# Patient Record
Sex: Female | Born: 1980 | Race: Black or African American | Hispanic: No | Marital: Married | Smoking: Former smoker
Health system: Southern US, Community
[De-identification: ages and names within clinical notes are randomized; demographics above are authoritative.]

## PROBLEM LIST (undated history)

## (undated) ENCOUNTER — Inpatient Hospital Stay (HOSPITAL_COMMUNITY): Payer: Self-pay

## (undated) DIAGNOSIS — I499 Cardiac arrhythmia, unspecified: Secondary | ICD-10-CM

## (undated) DIAGNOSIS — F4310 Post-traumatic stress disorder, unspecified: Secondary | ICD-10-CM

## (undated) DIAGNOSIS — D649 Anemia, unspecified: Secondary | ICD-10-CM

## (undated) HISTORY — PX: BREAST BIOPSY: SHX20

## (undated) HISTORY — PX: TENDON RECONSTRUCTION: SHX2487

---

## 2001-06-15 HISTORY — PX: BREAST BIOPSY: SHX20

## 2007-05-25 ENCOUNTER — Other Ambulatory Visit: Admission: RE | Admit: 2007-05-25 | Discharge: 2007-05-25 | Payer: Self-pay | Admitting: Gynecology

## 2007-10-27 ENCOUNTER — Other Ambulatory Visit: Admission: RE | Admit: 2007-10-27 | Discharge: 2007-10-27 | Payer: Self-pay | Admitting: Gynecology

## 2008-01-02 ENCOUNTER — Encounter: Admission: RE | Admit: 2008-01-02 | Discharge: 2008-01-02 | Payer: Self-pay | Admitting: Family Medicine

## 2008-03-27 ENCOUNTER — Other Ambulatory Visit: Admission: RE | Admit: 2008-03-27 | Discharge: 2008-03-27 | Payer: Self-pay | Admitting: Gynecology

## 2008-04-04 ENCOUNTER — Encounter (INDEPENDENT_AMBULATORY_CARE_PROVIDER_SITE_OTHER): Payer: Self-pay | Admitting: *Deleted

## 2008-04-04 ENCOUNTER — Ambulatory Visit (HOSPITAL_BASED_OUTPATIENT_CLINIC_OR_DEPARTMENT_OTHER): Admission: RE | Admit: 2008-04-04 | Discharge: 2008-04-04 | Payer: Self-pay | Admitting: *Deleted

## 2008-04-05 ENCOUNTER — Emergency Department (HOSPITAL_COMMUNITY): Admission: EM | Admit: 2008-04-05 | Discharge: 2008-04-05 | Payer: Self-pay | Admitting: Emergency Medicine

## 2008-08-07 ENCOUNTER — Emergency Department (HOSPITAL_COMMUNITY): Admission: EM | Admit: 2008-08-07 | Discharge: 2008-08-07 | Payer: Self-pay | Admitting: Emergency Medicine

## 2009-01-18 ENCOUNTER — Emergency Department (HOSPITAL_COMMUNITY): Admission: EM | Admit: 2009-01-18 | Discharge: 2009-01-18 | Payer: Self-pay | Admitting: Family Medicine

## 2009-08-13 ENCOUNTER — Ambulatory Visit (HOSPITAL_COMMUNITY): Admission: RE | Admit: 2009-08-13 | Discharge: 2009-08-13 | Payer: Self-pay | Admitting: Gynecology

## 2009-12-24 ENCOUNTER — Emergency Department (HOSPITAL_COMMUNITY): Admission: EM | Admit: 2009-12-24 | Discharge: 2009-12-24 | Payer: Self-pay | Admitting: Emergency Medicine

## 2009-12-24 ENCOUNTER — Emergency Department (HOSPITAL_COMMUNITY): Admission: EM | Admit: 2009-12-24 | Discharge: 2009-12-24 | Payer: Self-pay | Admitting: Family Medicine

## 2010-02-20 ENCOUNTER — Emergency Department (HOSPITAL_COMMUNITY): Admission: EM | Admit: 2010-02-20 | Discharge: 2010-02-20 | Payer: Self-pay | Admitting: Emergency Medicine

## 2010-04-30 ENCOUNTER — Encounter: Admission: RE | Admit: 2010-04-30 | Discharge: 2010-04-30 | Payer: Self-pay | Admitting: Family Medicine

## 2010-05-12 ENCOUNTER — Emergency Department (HOSPITAL_COMMUNITY)
Admission: EM | Admit: 2010-05-12 | Discharge: 2010-05-12 | Payer: Self-pay | Source: Home / Self Care | Admitting: Emergency Medicine

## 2010-08-26 LAB — POCT I-STAT, CHEM 8
BUN: 10 mg/dL (ref 6–23)
Creatinine, Ser: 0.9 mg/dL (ref 0.4–1.2)
Glucose, Bld: 104 mg/dL — ABNORMAL HIGH (ref 70–99)
Potassium: 3.7 mEq/L (ref 3.5–5.1)
Sodium: 140 mEq/L (ref 135–145)

## 2010-08-26 LAB — URINALYSIS, ROUTINE W REFLEX MICROSCOPIC
Bilirubin Urine: NEGATIVE
Hgb urine dipstick: NEGATIVE
Ketones, ur: >80 mg/dL — AB
Specific Gravity, Urine: 1.023 (ref 1.005–1.030)
Urobilinogen, UA: 1 mg/dL (ref 0.0–1.0)

## 2010-08-31 LAB — POCT I-STAT, CHEM 8
BUN: 16 mg/dL (ref 6–23)
Creatinine, Ser: 0.8 mg/dL (ref 0.4–1.2)
Glucose, Bld: 102 mg/dL — ABNORMAL HIGH (ref 70–99)
Potassium: 3.8 mEq/L (ref 3.5–5.1)
Sodium: 141 mEq/L (ref 135–145)

## 2010-08-31 LAB — POCT CARDIAC MARKERS
CKMB, poc: <1 ng/mL — ABNORMAL LOW (ref 1.0–8.0)
Myoglobin, poc: 34 ng/mL (ref 12–200)
Troponin i, poc: <0.05 ng/mL (ref 0.00–0.09)

## 2010-09-20 LAB — POCT URINALYSIS DIP (DEVICE)
Nitrite: NEGATIVE
Protein, ur: NEGATIVE mg/dL
Urobilinogen, UA: 1 mg/dL (ref 0.0–1.0)

## 2010-09-20 LAB — POCT PREGNANCY, URINE: Preg Test, Ur: NEGATIVE

## 2010-09-30 LAB — PREGNANCY, URINE: Preg Test, Ur: NEGATIVE

## 2010-10-28 NOTE — Op Note (Signed)
[name], [name]             ACCOUNT NO.:  [id number]   MEDICAL RECORD NO.:  [id number]          PATIENT TYPE:  AMB   LOCATION:  DSC                          FACILITY:  [facility]   PHYSICIAN:  Tennis Must Meyerdierks, M.D.DATE OF BIRTH:  13-Dec-1980   DATE OF PROCEDURE:  04/04/2008  DATE OF DISCHARGE:                               OPERATIVE REPORT   PREOPERATIVE DIAGNOSIS:  Chronic ulnar collateral ligament tear,  metacarpophalangeal joint, right thumb.   POSTOPERATIVE DIAGNOSIS:  Chronic ulnar collateral ligament tear,  metacarpophalangeal joint, right thumb with ganglion cyst formation.   PROCEDURE:  Excision of ganglion cyst with reconstruction of ulnar  collateral ligament, metacarpophalangeal joint, right thumb using local  tissue.   SURGEON:  Lowell Bouton, MD   ANESTHESIA:  General.   OPERATIVE FINDINGS:  The patient had a cyst that was parallel and dorsal  to the ulnar collateral ligament.  It ran along the ligament from the MP  joint to the attachment.  The collateral ligament itself was slightly  attenuated.  The joint surface was smooth.   PROCEDURE IN DETAIL:  Under general anesthesia with a tourniquet on the  right arm, the right hand was prepped and draped in usual fashion.  After exsanguinating the limb, the tourniquet was inflated to 250 mmHg.  A gently curved incision was made over the ulnar side of the MP joint of  the right thumb and carried down through the subcutaneous tissues.  Blunt dissection was carried down to the abductor aponeurosis and the  patient had a well-defined adductor muscle.  The abductor aponeurosis  was divided longitudinally and the ulnar collateral ligament was  identified, but there was a mass overlying it that almost looked like an  accessory muscle along the dorsum of the ligament.  When it was incised,  there was fluid and it was found to be a ganglion-type cyst but with  darker color, almost the color of a vein.  The  cyst was traced back to  the joint and was completely excised.  The collateral ligament was then  examined and was found to be slightly attenuated.  It was felt that  there was adequate tissue to advance that and not use a graft for  reconstruction.  The ligament was released from its insertion on the  proximal phalanx and a drill hole was made just distal.  The distal end  of the ligament was then prepared with a 2-0 Prolene Kessler suture.  The suture was then passed through with Mellody Dance needles from the ulnar to  the radial aspect of the proximal phalanx.  It was tucked into the drill  hole to tighten the ligament.  The sutures were then tied over a button  radially and the MP joint appeared to be stable.  X-rays showed that the  joint was slightly subluxed volarly, so it was pinned in a reduced  position.  A 0.045 K-wire was used to pin the joint and was bent over  and left protruding from the skin.  The wound was then irrigated.  The  abductor aponeurosis was closed with 4-0 Vicryl.  Skin was closed  with a 3-0 subcuticular Prolene.  Marcaine 0.5% was then placed in the  skin edges for pain control.  Sterile dressings were applied followed by  a thumb spica splint.  The tourniquet was released with good circulation  of the hand.  The patient went to the recovery room awake in stable and  good condition.      Lowell Bouton, M.D.  Electronically Signed     EMM/MEDQ  D:  04/04/2008  T:  04/05/2008  Job:  [id number]

## 2011-03-16 LAB — POCT I-STAT, CHEM 8
Calcium, Ion: 1.04 — ABNORMAL LOW
Chloride: 108
Glucose, Bld: 97
HCT: 36
Hemoglobin: 12.2
TCO2: 23

## 2011-08-02 ENCOUNTER — Emergency Department (HOSPITAL_COMMUNITY)
Admission: EM | Admit: 2011-08-02 | Discharge: 2011-08-02 | Disposition: A | Discharge status: Home / Self Care | Payer: Managed Care, Other (non HMO) | Attending: Emergency Medicine | Admitting: Emergency Medicine

## 2011-08-02 ENCOUNTER — Encounter (HOSPITAL_COMMUNITY): Payer: Self-pay | Admitting: *Deleted

## 2011-08-02 DIAGNOSIS — R102 Pelvic and perineal pain: Secondary | ICD-10-CM

## 2011-08-02 DIAGNOSIS — R10819 Abdominal tenderness, unspecified site: Secondary | ICD-10-CM | POA: Insufficient documentation

## 2011-08-02 DIAGNOSIS — N949 Unspecified condition associated with female genital organs and menstrual cycle: Secondary | ICD-10-CM | POA: Insufficient documentation

## 2011-08-02 HISTORY — DX: Cardiac arrhythmia, unspecified: I49.9

## 2011-08-02 HISTORY — DX: Anemia, unspecified: D64.9

## 2011-08-02 LAB — URINALYSIS, ROUTINE W REFLEX MICROSCOPIC
Bilirubin Urine: NEGATIVE
Ketones, ur: NEGATIVE mg/dL
Nitrite: NEGATIVE
pH: 6.5 (ref 5.0–8.0)

## 2011-08-02 NOTE — ED Notes (Signed)
Patient given discharge instructions, information, prescriptions, and diet order. Patient states that they adequately understand discharge information given and to return to ED if symptoms return or worsen.     

## 2011-08-02 NOTE — ED Provider Notes (Signed)
History     CSN: [id number]  Arrival date & time 08/02/11  0909   First MD Initiated Contact with Patient 08/02/11 [phone]      Chief Complaint  Patient presents with  . Abdominal Pain   pelvic pain  (Consider location/radiation/quality/duration/timing/severity/associated sxs/prior treatment) HPI This 31 year old female has a history of a ruptured ovarian cyst in the past. This morning she was going to the bathroom and felt fine then after she urinated without difficulty she had sudden onset of severe left pelvic pain just like prior ruptured cyst. The patient was maximum intensity at its onset and she felt worse and she tried to move around she wanted to stay still. The pain is rapidly improving and she took Tylenol just prior to arrival her pain is much better now and is mild to moderate now. The pain is not a colicky pain at all. The pain is much better now than when it first started. She is no vaginal bleeding or discharge. She has no difficulty urinating or dysuria. She has no flank pain upper abdominal pain nausea vomiting chest pain shortness of breath rashes fevers or trauma. The pain is nonradiating and without associated symptoms.  Past Medical History  Diagnosis Date  . Anemia   . Irregular heartbeat     Past Surgical History  Procedure Date  . Tendon reconstruction     Right thumb  . Breast biopsy     Right breast    History reviewed. No pertinent family history.  History  Substance Use Topics  . Smoking status: Never Smoker   . Smokeless tobacco: Not on file  . Alcohol Use: No    OB History    Grav Para Term Preterm Abortions TAB SAB Ect Mult Living                  Review of Systems  Constitutional: Negative for fever.       10 Systems reviewed and are negative for acute change except as noted in the HPI.  HENT: Negative for congestion.   Eyes: Negative for discharge and redness.  Respiratory: Negative for cough and shortness of breath.   Cardiovascular:  Negative for chest pain.  Gastrointestinal: Positive for abdominal pain. Negative for nausea, vomiting and diarrhea.  Genitourinary: Negative for dysuria, vaginal bleeding and vaginal discharge.  Musculoskeletal: Negative for back pain.  Skin: Negative for rash.  Neurological: Negative for syncope, numbness and headaches.  Psychiatric/Behavioral:       No behavior change.    Allergies  Codeine  Home Medications   Current Outpatient Rx  Name Route Sig Dispense Refill  . ACETAMINOPHEN 500 MG PO TABS Oral Take 1,000 mg by mouth every 6 (six) hours as needed. For pain.      BP 111/74  Pulse 67  Temp(Src) 98.6 F (37 C) (Oral)  Resp 16  Ht 5\' 8"  (1.727 m)  Wt 120 lb (54.432 kg)  BMI 18.25 kg/m2  SpO2 100%  LMP 07/09/2011  Physical Exam  Nursing note and vitals reviewed. Constitutional:       Awake, alert, nontoxic appearance.  HENT:  Head: Atraumatic.  Eyes: Right eye exhibits no discharge. Left eye exhibits no discharge.  Neck: Neck supple.  Cardiovascular: Normal rate and regular rhythm.   No murmur heard. Pulmonary/Chest: Effort normal and breath sounds normal. No respiratory distress. She has no wheezes. She has no rales. She exhibits no tenderness.  Abdominal: Soft. Bowel sounds are normal. She exhibits no mass. There is  tenderness. There is no rebound and no guarding.       Minimal left suprapubic tenderness the rest of the abdomen is nontender there is no rebound  Genitourinary:       Chaperone was present for bimanual examination with cervical motion tenderness present in the left adnexal tenderness present no right adnexal tenderness the rest of her abdomen is nontender there is no bleeding or discharge noted in the examination glove and no palpable masses  Musculoskeletal: She exhibits no tenderness.       Baseline ROM, no obvious new focal weakness.  Neurological: She is alert.       Mental status and motor strength appears baseline for patient and situation.    Skin: No rash noted.  Psychiatric: She has a normal mood and affect.    ED Course  Procedures (including critical care time) Patient / Family / Caregiver understand and agree with initial ED impression and plan with expectations set for ED visit. I doubt pelvic inflammatory disease or ovarian torsion. Pregnancy test and urinalysis are pending. The patient agrees there does not appear to be an indication for emergency pelvic ultrasound unless her pregnancy test is positive.  The patient does not want prescribed analgesics at all, she states her pain is controlled with Tylenol, her pain is not worsening at all in the emergency department and clinically we do not suspect ovarian torsion or peritonitis therefore I do not think the patient needs imaging today she understands and agrees with this assessment and plan as well. Her presentation is most suspicious for a ruptured ovarian cyst without apparent immediate complications. Labs Reviewed  URINALYSIS, ROUTINE W REFLEX MICROSCOPIC - Abnormal; Notable for the following:    APPearance CLOUDY (*)    All other components within normal limits  POCT PREGNANCY, URINE  LAB REPORT - SCANNED   No results found.   1. Pelvic pain       MDM  I doubt any other EMC precluding discharge at this time including, but not necessarily limited to the following:SBI, peritonitis, torsion, PID.        Hurman Horn, MD 08/03/11 2215

## 2011-08-02 NOTE — ED Notes (Signed)
Per pt- experienced a sharp pain in her left lower abdomen this morning after using the restroom. Pain was sharp and intense and now has subsided to a dull aching pain.

## 2011-09-01 ENCOUNTER — Encounter (HOSPITAL_COMMUNITY): Payer: Self-pay | Admitting: Emergency Medicine

## 2011-09-01 ENCOUNTER — Emergency Department (HOSPITAL_COMMUNITY)
Admission: EM | Admit: 2011-09-01 | Discharge: 2011-09-01 | Disposition: A | Discharge status: Home / Self Care | Payer: Managed Care, Other (non HMO) | Attending: Emergency Medicine | Admitting: Emergency Medicine

## 2011-09-01 DIAGNOSIS — Z0389 Encounter for observation for other suspected diseases and conditions ruled out: Secondary | ICD-10-CM | POA: Insufficient documentation

## 2011-09-01 LAB — URINE MICROSCOPIC-ADD ON

## 2011-09-01 LAB — URINALYSIS, ROUTINE W REFLEX MICROSCOPIC
Glucose, UA: NEGATIVE mg/dL
Protein, ur: NEGATIVE mg/dL

## 2011-09-01 LAB — POCT PREGNANCY, URINE: Preg Test, Ur: NEGATIVE

## 2011-09-01 NOTE — ED Notes (Signed)
Patient left AMA.

## 2011-09-01 NOTE — ED Notes (Signed)
Pt c/o nausea, vomiting and diarrhea. Onset this am.

## 2011-09-29 ENCOUNTER — Encounter (HOSPITAL_COMMUNITY): Payer: Self-pay | Admitting: Emergency Medicine

## 2011-09-29 ENCOUNTER — Emergency Department (INDEPENDENT_AMBULATORY_CARE_PROVIDER_SITE_OTHER)
Admission: EM | Admit: 2011-09-29 | Discharge: 2011-09-29 | Disposition: A | Discharge status: Home / Self Care | Payer: Managed Care, Other (non HMO) | Source: Home / Self Care | Attending: Family Medicine | Admitting: Family Medicine

## 2011-09-29 DIAGNOSIS — J020 Streptococcal pharyngitis: Secondary | ICD-10-CM

## 2011-09-29 LAB — POCT RAPID STREP A: Streptococcus, Group A Screen (Direct): POSITIVE — AB

## 2011-09-29 MED ORDER — AMOXICILLIN 500 MG PO CAPS: 500.0000 mg | ORAL_CAPSULE | Freq: Three times a day (TID) | ORAL | Status: AC

## 2011-09-29 NOTE — Discharge Instructions (Signed)
Drink lots of fluids, take all of medicine, use lozenges as needed.return if needed °

## 2011-09-29 NOTE — ED Notes (Signed)
Sore throat, chills, body aches, and headache.  Reports fever of 100.4.  Denies nausea, vomiting or diarrhea

## 2011-09-29 NOTE — ED Provider Notes (Signed)
History     CSN: [id number]  Arrival date & time 09/29/11  7829   First MD Initiated Contact with Patient 09/29/11 [phone]      Chief Complaint  Patient presents with  . Sore Throat    (Consider location/radiation/quality/duration/timing/severity/associated sxs/prior treatment) Patient is a 31 y.o. female presenting with pharyngitis. The history is provided by the patient.  Sore Throat This is a new problem. The current episode started yesterday. The problem has been gradually worsening. Pertinent negatives include no chest pain, no abdominal pain and no shortness of breath. The symptoms are aggravated by swallowing.    Past Medical History  Diagnosis Date  . Anemia   . Irregular heartbeat     Past Surgical History  Procedure Date  . Tendon reconstruction     Right thumb  . Breast biopsy     Right breast    No family history on file.  History  Substance Use Topics  . Smoking status: Never Smoker   . Smokeless tobacco: Not on file  . Alcohol Use: No    OB History    Grav Para Term Preterm Abortions TAB SAB Ect Mult Living                  Review of Systems  Constitutional: Positive for fever and chills.  HENT: Positive for sore throat.   Respiratory: Negative for cough and shortness of breath.   Cardiovascular: Negative for chest pain.  Gastrointestinal: Negative for abdominal pain.  Musculoskeletal: Positive for myalgias.  Skin: Negative for rash.    Allergies  Codeine and Vicodin  Home Medications   Current Outpatient Rx  Name Route Sig Dispense Refill  . ALKA-SELTZER PLUS COLD PO Oral Take by mouth.    . ACETAMINOPHEN 500 MG PO TABS Oral Take 1,000 mg by mouth every 6 (six) hours as needed. For pain.    [name] AMOXICILLIN 500 MG PO CAPS Oral Take 1 capsule (500 mg total) by mouth 3 (three) times daily. 30 capsule 0  . ESOMEPRAZOLE MAGNESIUM 40 MG PO CPDR Oral Take 40 mg by mouth daily before breakfast.    . NORETHINDRONE ACET-ETHINYL EST 1.5-30 MG-MCG  PO TABS Oral Take 1 tablet by mouth daily.      BP 114/68  Pulse 90  Temp(Src) 98.3 F (36.8 C) (Oral)  Resp 14  SpO2 100%  LMP 09/24/2011  Physical Exam  Nursing note and vitals reviewed. Constitutional: She is oriented to person, place, and time. She appears well-developed and well-nourished.  HENT:  Head: Normocephalic.  Right Ear: External ear normal.  Left Ear: External ear normal.  Mouth/Throat: Mucous membranes are normal. Oropharyngeal exudate and posterior oropharyngeal erythema present.  Cardiovascular: Normal rate, regular rhythm, normal heart sounds and intact distal pulses.   Pulmonary/Chest: Breath sounds normal.  Lymphadenopathy:    She has cervical adenopathy.  Neurological: She is alert and oriented to person, place, and time.  Skin: No rash noted.    ED Course  Procedures (including critical care time)  Labs Reviewed  POCT RAPID STREP A ([facility] CARE ONLY) - Abnormal; Notable for the following:    Streptococcus, Group A Screen (Direct) POSITIVE (*)    All other components within normal limits  LAB REPORT - SCANNED   No results found.   1. Strep sore throat       MDM          Linna Hoff, MD 10/07/11 Windell Moment

## 2012-12-26 LAB — OB RESULTS CONSOLE HGB/HCT, BLOOD
HCT: 37 %
HEMOGLOBIN: 12.9 g/dL

## 2012-12-26 LAB — OB RESULTS CONSOLE RPR: RPR: NONREACTIVE

## 2012-12-26 LAB — OB RESULTS CONSOLE HIV ANTIBODY (ROUTINE TESTING): HIV: NONREACTIVE

## 2012-12-26 LAB — OB RESULTS CONSOLE PLATELET COUNT: Platelets: 211 10*3/uL

## 2012-12-26 LAB — OB RESULTS CONSOLE RUBELLA ANTIBODY, IGM: RUBELLA: IMMUNE

## 2012-12-26 LAB — OB RESULTS CONSOLE GC/CHLAMYDIA
Chlamydia: NEGATIVE
Gonorrhea: NEGATIVE

## 2012-12-26 LAB — OB RESULTS CONSOLE HEPATITIS B SURFACE ANTIGEN: HEP B S AG: NEGATIVE

## 2013-07-09 ENCOUNTER — Inpatient Hospital Stay (HOSPITAL_COMMUNITY)
Admission: EM | Admit: 2013-07-09 | Discharge: 2013-07-12 | DRG: 766 | Disposition: A | Discharge status: Home / Self Care | Payer: Managed Care, Other (non HMO) | Attending: Obstetrics and Gynecology | Admitting: Obstetrics and Gynecology

## 2013-07-09 ENCOUNTER — Encounter (HOSPITAL_COMMUNITY): Payer: Self-pay | Admitting: Emergency Medicine

## 2013-07-09 ENCOUNTER — Encounter (HOSPITAL_COMMUNITY): Payer: Managed Care, Other (non HMO) | Admitting: Anesthesiology

## 2013-07-09 ENCOUNTER — Other Ambulatory Visit: Payer: Self-pay

## 2013-07-09 ENCOUNTER — Encounter (HOSPITAL_COMMUNITY)
Admission: EM | Disposition: A | Discharge status: Home / Self Care | Payer: Self-pay | Source: Home / Self Care | Attending: Obstetrics and Gynecology

## 2013-07-09 ENCOUNTER — Inpatient Hospital Stay (HOSPITAL_COMMUNITY): Payer: Managed Care, Other (non HMO) | Admitting: Anesthesiology

## 2013-07-09 ENCOUNTER — Emergency Department (HOSPITAL_COMMUNITY): Payer: Managed Care, Other (non HMO)

## 2013-07-09 DIAGNOSIS — R0602 Shortness of breath: Secondary | ICD-10-CM

## 2013-07-09 DIAGNOSIS — J111 Influenza due to unidentified influenza virus with other respiratory manifestations: Secondary | ICD-10-CM | POA: Diagnosis present

## 2013-07-09 DIAGNOSIS — J101 Influenza due to other identified influenza virus with other respiratory manifestations: Secondary | ICD-10-CM

## 2013-07-09 DIAGNOSIS — O9989 Other specified diseases and conditions complicating pregnancy, childbirth and the puerperium: Secondary | ICD-10-CM

## 2013-07-09 DIAGNOSIS — M79609 Pain in unspecified limb: Secondary | ICD-10-CM

## 2013-07-09 DIAGNOSIS — O321XX0 Maternal care for breech presentation, not applicable or unspecified: Secondary | ICD-10-CM | POA: Diagnosis present

## 2013-07-09 DIAGNOSIS — O99892 Other specified diseases and conditions complicating childbirth: Secondary | ICD-10-CM | POA: Diagnosis present

## 2013-07-09 DIAGNOSIS — R079 Chest pain, unspecified: Secondary | ICD-10-CM | POA: Diagnosis present

## 2013-07-09 DIAGNOSIS — Z87891 Personal history of nicotine dependence: Secondary | ICD-10-CM

## 2013-07-09 LAB — URINALYSIS, ROUTINE W REFLEX MICROSCOPIC
BILIRUBIN URINE: NEGATIVE
Glucose, UA: NEGATIVE mg/dL
Hgb urine dipstick: NEGATIVE
Ketones, ur: NEGATIVE mg/dL
Leukocytes, UA: NEGATIVE
NITRITE: NEGATIVE
PROTEIN: NEGATIVE mg/dL
SPECIFIC GRAVITY, URINE: 1.006 (ref 1.005–1.030)
UROBILINOGEN UA: 0.2 mg/dL (ref 0.0–1.0)
pH: 7.5 (ref 5.0–8.0)

## 2013-07-09 LAB — COMPREHENSIVE METABOLIC PANEL
ALT: 21 U/L (ref 0–35)
AST: 29 U/L (ref 0–37)
Albumin: 2.8 g/dL — ABNORMAL LOW (ref 3.5–5.2)
Alkaline Phosphatase: 104 U/L (ref 39–117)
BILIRUBIN TOTAL: 0.6 mg/dL (ref 0.3–1.2)
BUN: 4 mg/dL — ABNORMAL LOW (ref 6–23)
CALCIUM: 8.8 mg/dL (ref 8.4–10.5)
CO2: 21 meq/L (ref 19–32)
CREATININE: 0.65 mg/dL (ref 0.50–1.10)
Chloride: 103 mEq/L (ref 96–112)
GLUCOSE: 84 mg/dL (ref 70–99)
Potassium: 3.8 mEq/L (ref 3.7–5.3)
Sodium: 137 mEq/L (ref 137–147)
Total Protein: 6.5 g/dL (ref 6.0–8.3)

## 2013-07-09 LAB — CBC
HCT: 32.5 % — ABNORMAL LOW (ref 36.0–46.0)
Hemoglobin: 11.2 g/dL — ABNORMAL LOW (ref 12.0–15.0)
MCH: 32.3 pg (ref 26.0–34.0)
MCHC: 34.5 g/dL (ref 30.0–36.0)
MCV: 93.7 fL (ref 78.0–100.0)
Platelets: 163 10*3/uL (ref 150–400)
RBC: 3.47 MIL/uL — ABNORMAL LOW (ref 3.87–5.11)
RDW: 14.3 % (ref 11.5–15.5)
WBC: 11.7 10*3/uL — ABNORMAL HIGH (ref 4.0–10.5)

## 2013-07-09 LAB — TYPE AND SCREEN
ABO/RH(D): B POS
ANTIBODY SCREEN: NEGATIVE

## 2013-07-09 LAB — CBC WITH DIFFERENTIAL/PLATELET
Basophils Absolute: 0 10*3/uL (ref 0.0–0.1)
Basophils Relative: 0 % (ref 0–1)
EOS PCT: 0 % (ref 0–5)
Eosinophils Absolute: 0 10*3/uL (ref 0.0–0.7)
HEMATOCRIT: 33.6 % — AB (ref 36.0–46.0)
HEMOGLOBIN: 11.6 g/dL — AB (ref 12.0–15.0)
LYMPHS ABS: 0.8 10*3/uL (ref 0.7–4.0)
LYMPHS PCT: 8 % — AB (ref 12–46)
MCH: 33 pg (ref 26.0–34.0)
MCHC: 34.5 g/dL (ref 30.0–36.0)
MCV: 95.7 fL (ref 78.0–100.0)
MONO ABS: 0.4 10*3/uL (ref 0.1–1.0)
MONOS PCT: 4 % (ref 3–12)
Neutro Abs: 8.7 10*3/uL — ABNORMAL HIGH (ref 1.7–7.7)
Neutrophils Relative %: 87 % — ABNORMAL HIGH (ref 43–77)
Platelets: 174 10*3/uL (ref 150–400)
RBC: 3.51 MIL/uL — AB (ref 3.87–5.11)
RDW: 14.1 % (ref 11.5–15.5)
WBC: 10 10*3/uL (ref 4.0–10.5)

## 2013-07-09 LAB — INFLUENZA PANEL BY PCR (TYPE A & B)
H1N1 flu by pcr: NOT DETECTED
INFLAPCR: POSITIVE — AB
INFLBPCR: NEGATIVE

## 2013-07-09 SURGERY — Surgical Case
Anesthesia: Spinal

## 2013-07-09 MED ORDER — KETOROLAC TROMETHAMINE 30 MG/ML IJ SOLN: 15.0000 mg | Freq: Once | INTRAMUSCULAR | Status: AC | PRN

## 2013-07-09 MED ORDER — PHENYLEPHRINE HCL 10 MG/ML IJ SOLN
INTRAMUSCULAR | Status: DC | PRN
  Administered 2013-07-09: 80 ug via INTRAVENOUS
  Administered 2013-07-09: 120 ug via INTRAVENOUS
  Administered 2013-07-09 (×3): 80 ug via INTRAVENOUS

## 2013-07-09 MED ORDER — MORPHINE SULFATE 0.5 MG/ML IJ SOLN
INTRAMUSCULAR | Status: AC
  Filled 2013-07-09: qty 10

## 2013-07-09 MED ORDER — KETOROLAC TROMETHAMINE 30 MG/ML IJ SOLN
INTRAMUSCULAR | Status: AC
  Administered 2013-07-09: 30 mg via INTRAVENOUS
  Filled 2013-07-09: qty 1

## 2013-07-09 MED ORDER — METOCLOPRAMIDE HCL 5 MG/ML IJ SOLN
INTRAMUSCULAR | Status: DC | PRN
  Administered 2013-07-09: 10 mg via INTRAVENOUS

## 2013-07-09 MED ORDER — EPHEDRINE 5 MG/ML INJ
INTRAVENOUS | Status: AC
  Filled 2013-07-09: qty 10

## 2013-07-09 MED ORDER — KETOROLAC TROMETHAMINE 30 MG/ML IJ SOLN
30.0000 mg | Freq: Four times a day (QID) | INTRAMUSCULAR | Status: AC | PRN
  Administered 2013-07-09: 30 mg via INTRAVENOUS

## 2013-07-09 MED ORDER — FENTANYL CITRATE 0.05 MG/ML IJ SOLN: 25.0000 ug | INTRAMUSCULAR | Status: DC | PRN

## 2013-07-09 MED ORDER — KETOROLAC TROMETHAMINE 30 MG/ML IJ SOLN
30.0000 mg | Freq: Four times a day (QID) | INTRAMUSCULAR | Status: AC | PRN
Start: 2013-07-10 — End: 2013-07-10

## 2013-07-09 MED ORDER — PHENYLEPHRINE 40 MCG/ML (10ML) SYRINGE FOR IV PUSH (FOR BLOOD PRESSURE SUPPORT)
PREFILLED_SYRINGE | INTRAVENOUS | Status: AC
Start: 2013-07-09 — End: 2013-07-09
  Filled 2013-07-09: qty 5

## 2013-07-09 MED ORDER — SCOPOLAMINE 1 MG/3DAYS TD PT72
1.0000 | MEDICATED_PATCH | Freq: Once | TRANSDERMAL | Status: DC
  Administered 2013-07-09: 1.5 mg via TRANSDERMAL

## 2013-07-09 MED ORDER — MEPERIDINE HCL 25 MG/ML IJ SOLN: 6.2500 mg | INTRAMUSCULAR | Status: DC | PRN

## 2013-07-09 MED ORDER — OXYTOCIN 10 UNIT/ML IJ SOLN
INTRAMUSCULAR | Status: AC
  Filled 2013-07-09: qty 4

## 2013-07-09 MED ORDER — PROMETHAZINE HCL 25 MG/ML IJ SOLN
6.2500 mg | INTRAMUSCULAR | Status: DC | PRN
  Administered 2013-07-10: 6.25 mg via INTRAVENOUS

## 2013-07-09 MED ORDER — DEXTROSE 5 % IV SOLN
2.0000 g | INTRAVENOUS | Status: AC
  Administered 2013-07-09: 2 g via INTRAVENOUS
  Filled 2013-07-09: qty 2

## 2013-07-09 MED ORDER — LACTATED RINGERS IV SOLN
INTRAVENOUS | Status: DC | PRN
  Administered 2013-07-09 (×2): via INTRAVENOUS

## 2013-07-09 MED ORDER — CITRIC ACID-SODIUM CITRATE 334-500 MG/5ML PO SOLN
ORAL | Status: AC
  Filled 2013-07-09: qty 15

## 2013-07-09 MED ORDER — OSELTAMIVIR PHOSPHATE 75 MG PO CAPS
75.0000 mg | ORAL_CAPSULE | Freq: Two times a day (BID) | ORAL | Status: DC
  Administered 2013-07-10 – 2013-07-12 (×7): 75 mg via ORAL
  Filled 2013-07-09 (×6): qty 1

## 2013-07-09 MED ORDER — MORPHINE SULFATE (PF) 0.5 MG/ML IJ SOLN
INTRAMUSCULAR | Status: DC | PRN
  Administered 2013-07-09: .2 mg via INTRATHECAL

## 2013-07-09 MED ORDER — DIPHENHYDRAMINE HCL 50 MG/ML IJ SOLN
INTRAMUSCULAR | Status: DC | PRN
  Administered 2013-07-09: 25 mg via INTRAVENOUS

## 2013-07-09 MED ORDER — BUPIVACAINE IN DEXTROSE 0.75-8.25 % IT SOLN
INTRATHECAL | Status: DC | PRN
  Administered 2013-07-09: 1.6 mL via INTRATHECAL

## 2013-07-09 MED ORDER — EPHEDRINE SULFATE 50 MG/ML IJ SOLN
INTRAMUSCULAR | Status: DC | PRN
  Administered 2013-07-09 (×2): 10 mg via INTRAVENOUS

## 2013-07-09 MED ORDER — ONDANSETRON HCL 4 MG/2ML IJ SOLN
INTRAMUSCULAR | Status: AC
  Filled 2013-07-09: qty 2

## 2013-07-09 MED ORDER — FAMOTIDINE IN NACL 20-0.9 MG/50ML-% IV SOLN
20.0000 mg | Freq: Once | INTRAVENOUS | Status: AC
  Administered 2013-07-09 (×2): 20 mg via INTRAVENOUS
  Filled 2013-07-09: qty 50

## 2013-07-09 MED ORDER — CITRIC ACID-SODIUM CITRATE 334-500 MG/5ML PO SOLN
30.0000 mL | Freq: Once | ORAL | Status: AC
  Administered 2013-07-09: 30 mL via ORAL

## 2013-07-09 MED ORDER — OXYTOCIN 10 UNIT/ML IJ SOLN
40.0000 [IU] | INTRAVENOUS | Status: DC | PRN
  Administered 2013-07-09: 40 [IU] via INTRAVENOUS

## 2013-07-09 MED ORDER — GUAIFENESIN-DM 100-10 MG/5ML PO SYRP
5.0000 mL | ORAL_SOLUTION | ORAL | Status: DC | PRN
  Filled 2013-07-09: qty 5

## 2013-07-09 MED ORDER — PHENYLEPHRINE 40 MCG/ML (10ML) SYRINGE FOR IV PUSH (FOR BLOOD PRESSURE SUPPORT)
PREFILLED_SYRINGE | INTRAVENOUS | Status: AC
  Filled 2013-07-09: qty 5

## 2013-07-09 MED ORDER — ONDANSETRON HCL 4 MG/2ML IJ SOLN
INTRAMUSCULAR | Status: DC | PRN
  Administered 2013-07-09: 4 mg via INTRAVENOUS

## 2013-07-09 MED ORDER — FENTANYL CITRATE 0.05 MG/ML IJ SOLN
INTRAMUSCULAR | Status: DC | PRN
  Administered 2013-07-09: 25 ug via INTRAVENOUS
  Administered 2013-07-09: 40 ug via INTRAVENOUS
  Administered 2013-07-09: 10 ug via INTRATHECAL

## 2013-07-09 MED ORDER — ACETAMINOPHEN 500 MG PO TABS
1000.0000 mg | ORAL_TABLET | Freq: Once | ORAL | Status: AC
  Administered 2013-07-09: 1000 mg via ORAL
  Filled 2013-07-09: qty 2

## 2013-07-09 MED ORDER — SCOPOLAMINE 1 MG/3DAYS TD PT72
MEDICATED_PATCH | TRANSDERMAL | Status: AC
  Administered 2013-07-09: 1.5 mg via TRANSDERMAL
  Filled 2013-07-09: qty 1

## 2013-07-09 MED ORDER — FENTANYL CITRATE 0.05 MG/ML IJ SOLN
INTRAMUSCULAR | Status: AC
  Filled 2013-07-09: qty 2

## 2013-07-09 SURGICAL SUPPLY — 28 items
CLAMP CORD UMBIL (MISCELLANEOUS) IMPLANT
CLOTH BEACON ORANGE TIMEOUT ST (SAFETY) ×3 IMPLANT
DRAPE LG THREE QUARTER DISP (DRAPES) IMPLANT
DRSG OPSITE POSTOP 4X10 (GAUZE/BANDAGES/DRESSINGS) ×3 IMPLANT
DURAPREP 26ML APPLICATOR (WOUND CARE) ×3 IMPLANT
ELECT REM PT RETURN 9FT ADLT (ELECTROSURGICAL) ×3
ELECTRODE REM PT RTRN 9FT ADLT (ELECTROSURGICAL) ×1 IMPLANT
EXTRACTOR VACUUM M CUP 4 TUBE (SUCTIONS) IMPLANT
EXTRACTOR VACUUM M CUP 4' TUBE (SUCTIONS)
GLOVE SURG ORTHO 8.0 STRL STRW (GLOVE) ×3 IMPLANT
GOWN STRL REUS W/ TWL XL LVL3 (GOWN DISPOSABLE) ×1 IMPLANT
GOWN STRL REUS W/TWL LRG LVL3 (GOWN DISPOSABLE) ×3 IMPLANT
GOWN STRL REUS W/TWL XL LVL3 (GOWN DISPOSABLE) ×2
KIT ABG SYR 3ML LUER SLIP (SYRINGE) ×3 IMPLANT
NEEDLE HYPO 25X5/8 SAFETYGLIDE (NEEDLE) ×3 IMPLANT
NS IRRIG 1000ML POUR BTL (IV SOLUTION) ×3 IMPLANT
PACK C SECTION WH (CUSTOM PROCEDURE TRAY) ×3 IMPLANT
PAD OB MATERNITY 4.3X12.25 (PERSONAL CARE ITEMS) ×3 IMPLANT
STAPLER VISISTAT 35W (STAPLE) IMPLANT
SUT MNCRL 0 VIOLET CTX 36 (SUTURE) ×3 IMPLANT
SUT MONOCRYL 0 CTX 36 (SUTURE) ×6
SUT PDS AB 1 CT  36 (SUTURE)
SUT PDS AB 1 CT 36 (SUTURE) IMPLANT
SUT VIC AB 1 CTX 36 (SUTURE)
SUT VIC AB 1 CTX36XBRD ANBCTRL (SUTURE) IMPLANT
TOWEL OR 17X24 6PK STRL BLUE (TOWEL DISPOSABLE) ×3 IMPLANT
TRAY FOLEY CATH 14FR (SET/KITS/TRAYS/PACK) ×3 IMPLANT
WATER STERILE IRR 1000ML POUR (IV SOLUTION) ×3 IMPLANT

## 2013-07-09 NOTE — ED Notes (Signed)
[facility] states it will be over a hour before the their truck can be at [facility] to transport [facility]. [place] [facility] called for possible transport  

## 2013-07-09 NOTE — H&P (Addendum)
[name] is a 33 y.o. female presenting for chest discomfort to the [facility] [name].  Pregnancy complicated by breech presentation.  I was called by [name] (RN) who was at  to evaluate tracing while she was being examined.  Fetal tachcardia with moderate variabilty and contractions (mild) every 3-5 minutes.  I ordered IVF, oxygen, and position changes  Was called back and the dopplers, CxR were normal as was CBC and the tracing was not improving.  I [facility] for further eval of the fetus and tracing.  This was at 7:18 pm.  I called back to see [facility] and apparently [facility] was busy and [facility].  It's now 9:00pm and [name] states the tachycardia is 180 and minimal variabilty and no decels.  I asked again for immediate [facility] to [facility].  I called the [facility] at [facility] to prepare them for emergent cesarean section on EMS arrival.. On arrival, pt was placed in MAU.  Tracing had improved somewhat now with the baseline in the 150s, still with decreased variabilty and ctxs now moderate every 3-5 minutes.  Tracing Cat 2 History OB History   Grav Para Term Preterm Abortions TAB SAB Ect Mult Living   1              Past Medical History  Diagnosis Date  . Anemia   . Irregular heartbeat    Past Surgical History  Procedure Laterality Date  . Tendon reconstruction      Right thumb  . Breast biopsy      Right breast   Family History: family history is not on file. Social History:  reports that she has quit smoking. She does not have any smokeless tobacco history on file. She reports that she does not drink alcohol or use illicit drugs.   Prenatal Transfer Tool  Maternal Diabetes: No Genetic Screening: Normal Maternal Ultrasounds/Referrals: Normal Fetal Ultrasounds or other Referrals:  None Maternal Substance Abuse:  No Significant Maternal Medications:  None Significant Maternal Lab  Results:  None Other Comments:  None  ROS    Blood pressure 97/52, pulse 102, temperature 99.4 F (37.4 C), temperature source Oral, resp. rate 20, SpO2 100.00%. Exam Physical Exam  Prenatal labs: ABO, Rh:   Antibody:   Rubella:   RPR:    HBsAg:    HIV:    GBS:     Assessment/Plan: IUP at 38 weeks Fetal tachycardia and nonreassuring fetal tracing.  Plan primary cesarean section for this and breech Influenza A returned positive.  Will treat with Tamiflu.  Patient did have influenza vaccine at 22 weeks Risks and benefits of C/S were discussed.  All questions were answered and informed consent was obtained.  Plan to proceed with low segment transverse Cesarean Section.   [name] C 07/09/2013, 9:08 PM

## 2013-07-09 NOTE — Progress Notes (Signed)
400ml lr bolus infused. 02 at 10 liters by non-rebreather facemask. resp in to do chest x-ray. lr at 11125ml/hr. Lungs clear bilaterally. Pt states she has had a cols and a cough.

## 2013-07-09 NOTE — Transfer of Care (Signed)
Immediate Anesthesia Transfer of Care Note  Patient: [name]  Procedure(s) Performed: Procedure(s): CESAREAN SECTION (N/A)  Patient Location: PACU  Anesthesia Type:Spinal  Level of Consciousness: awake, alert  and oriented  Airway & Oxygen Therapy: Patient Spontanous Breathing and Patient connected to nasal cannula oxygen  Post-op Assessment: Report given to PACU RN and Post -op Vital signs reviewed and stable  Post vital signs: Reviewed and stable  Complications: No apparent anesthesia complications

## 2013-07-09 NOTE — ED Notes (Signed)
RAPID OB nurse at bedside.

## 2013-07-09 NOTE — ED Notes (Signed)
Pt here from home with c/o chest pain that started today , pain is in the middle of her chest and radiated to her back , pt some sob . Pt is [redacted] weeks pregnant with 1 st child , pt has one miscarriage 2 years ago , pt also pain in the left arm , no abd pain , pt is up to date with her OB MD Due for c section feb 6th

## 2013-07-09 NOTE — Progress Notes (Signed)
Dr Rana SnareLowe on call not Dr. Renaldo FiddlerAdkins. Spoke with Dr. Jone BasemanLoweand told him pt is here for c/o chest pain today. No chest pain now. Fhr 165 when I first placed her on the monitor with uc's every 2 min. Pt rating uc's 1 on a scale of 10. After getting pt up to the bathroom, fhr is 170-175bpm. EKG is nl. Chest x-ray is to be done. States to start IV fluids, lr and to give bolus.

## 2013-07-09 NOTE — Progress Notes (Signed)
Venous dopplers being done on legs.

## 2013-07-09 NOTE — Progress Notes (Signed)
Pt presents with c/o chest pain that started around 3:45 pm today. Pt states that pain was in the middle of her chest and in her back. Pain radiated to her left shoulder and down her left leg. She denies being diaphoretic with this pain or being nauseted. Pt states she is scheduled for a C/S for Feb 6th.

## 2013-07-09 NOTE — Consult Note (Signed)
Neonatology Note:   Attendance at C-section:    I was asked by Dr. Rana SnareLowe to attend this primary C/S at 37 3/7 weeks due to breech presentation. The mother is a G1P0 B pos, GBS pos with Influenza A diagnosed today. She was having some contractions and NRFHR on monitoring. ROM at delivery, fluid clear. Infant was a bit floppy and did not breathe spontaneously. She was given stimulation by Dr. Rana SnareLowe on the abdomen and began to cry just before 1 min of age. She was placed on the warmer shortly after 1 min of life and had decreased tone at that time, but otherwise was doing well. Needed only minimal bulb suctioning. Ap 8/9. Tone was normal at 5 min. Lungs clear to ausc in DR. To CN to care of Pediatrician.   Ruth Souhristie C. Raymond Bhardwaj, MD

## 2013-07-09 NOTE — Progress Notes (Signed)
Bilateral lower extremity venous duplex:  No evidence of DVT, superficial thrombosis, or Baker's Cyst.   

## 2013-07-09 NOTE — ED Notes (Signed)
Pt transferred from ED [facility]

## 2013-07-09 NOTE — Op Note (Signed)
Cesarean Section Procedure Note  Pre-operative Diagnosis: IUP at 38 weeks, Influenza, Fetal tachycardia with nonreassuring FHR, Breech  Post-operative Diagnosis: same  Surgeon: Turner DanielsLOWE,Jordanne Elsbury C   Assistants: none  Anesthesia:Spinal  Procedure:  Low Segment Transverse cesarean section  Procedure Details  The patient was seen in the Holding Room. The risks, benefits, complications, treatment options, and expected outcomes were discussed with the patient.  The patient concurred with the proposed plan, giving informed consent.  The site of surgery properly noted/marked.. A Time Out was held and the above information confirmed.  After induction of anesthesia, the patient was draped and prepped in the usual sterile manner. A Pfannenstiel incision was made and carried down through the subcutaneous tissue to the fascia. Fascial incision was made and extended transversely. The fascia was separated from the underlying rectus tissue superiorly and inferiorly. The peritoneum was identified and entered. Peritoneal incision was extended longitudinally. The utero-vesical peritoneal reflection was incised transversely and the bladder flap was bluntly freed from the lower uterine segment. A low transverse uterine incision was made. [facility] was a baby with Apgar scores of 8 at one minute and 9 at five minutes. After the umbilical cord was clamped and cut cord blood was obtained for evaluation. The placenta was removed intact and appeared normal. The uterine outline, tubes and ovaries appeared normal. The uterine incision was closed with running locked sutures of 0 monocryl and imbricated with 0 monocryl. Hemostasis was observed. Lavage was carried out until clear. The peritoneum was then closed with 0 monocryl and rectus muscles plicated in the midline.  After hemostasis was assured, the fascia was then reapproximated with running sutures of 0 Vicryl. Irrigation was applied and after  adequate hemostasis was assured, the skin was reapproximated with staples.  Instrument, sponge, and needle counts were correct prior the abdominal closure and at the conclusion of the case. The patient received 2 grams cefotetan preoperatively.  Findings: Viable female  Estimated Blood Loss:  400cc         Specimens: Placenta was sent to Pathology         Complications:  None

## 2013-07-09 NOTE — Anesthesia Procedure Notes (Signed)
Spinal  Patient location during procedure: OR Start time: 07/09/2013 10:28 PM End time: 07/09/2013 10:30 PM Staffing Anesthesiologist: Leilani AbleHATCHETT, Fraida Veldman Performed by: anesthesiologist  Preanesthetic Checklist Completed: patient identified, surgical consent, pre-op evaluation, timeout performed, IV checked, risks and benefits discussed and monitors and equipment checked Spinal Block Patient position: sitting Prep: DuraPrep Patient monitoring: heart rate, cardiac monitor, continuous pulse ox and blood pressure Approach: midline Location: L3-4 Injection technique: single-shot Needle Needle type: Sprotte  Needle gauge: 24 G Needle length: 9 cm Needle insertion depth: 5 cm Assessment Sensory level: T4

## 2013-07-09 NOTE — Progress Notes (Signed)
LR hung and bolus infusing.

## 2013-07-09 NOTE — Progress Notes (Signed)
Spoke with Dr. Rana SnareLowe and told him that fhr is 175bpm, min- mod variability,no accels, no decels. 02 cont at 10 litersby non-rebreather face mask. Pt has had lr fluid bolus. Chest x-ray wasneg, venous dopplers were neg. No c/o chest pain now. Pt is scheduled for c/s feb 6th. Baby is breech. Pt is to receive a gram of tylenol and [facility]. Temp 99.4. [facility].

## 2013-07-09 NOTE — ED Notes (Signed)
Pt states constant midsternal chest pressure. Onset this afternoon while resting at home. Denies chest pain at the time. Also states shortness of breath. Respirations unlabored. Speaking complete sentences. Lung sounds clear and equal bilaterally. Pt is conscious alert and oriented x4. Sinus tachycardia on monitor. Pt is x38 weeks pregnant.

## 2013-07-09 NOTE — Progress Notes (Signed)
Dr. Renaldo FiddlerAdkins cell called. Left message.

## 2013-07-09 NOTE — ED Provider Notes (Signed)
CSN: [id number]     Arrival date & time 07/09/13  1639 History   First MD Initiated Contact with Patient 07/09/13 1646     Chief Complaint  Patient presents with  . Chest Pain    HPI: [name] is a 33 yo F who is currently [redacted] weeks pregnant, who presents with chest pain and shortness of breath. She has had cough, nasal congestion and chest congestion since last night. Today her chest congestion worsened. She took OTC Tylenol allergy and mucinex, about 2 hours later she started to have sharp chest pain and SOB. Initial episode of chest pain lasted for five minutes then resolved. She has continued to have intermittent sharp pain. She also endorses constant, chest "tightness," associated with SOB. Pain does not appear to be pleuritic or exertional in nature. No alleviating or exacerbating factors. She called her OB who recommended ED evaluation. Her chest pain is an 8/10 at its maximum. Pain radiates to her back and occasionally left arm. She has had increased fetal movement, no contractions, abdominal pain, vaginal bleeding or discharge. No fever or chills.    Past Medical History  Diagnosis Date  . Anemia   . Irregular heartbeat    Past Surgical History  Procedure Laterality Date  . Tendon reconstruction      Right thumb  . Breast biopsy      Right breast   History reviewed. No pertinent family history. History  Substance Use Topics  . Smoking status: Former Games developermoker  . Smokeless tobacco: Not on file  . Alcohol Use: No   OB History   Grav Para Term Preterm Abortions TAB SAB Ect Mult Living   1              Review of Systems  Constitutional: Negative for fever, chills, appetite change and fatigue.  HENT: Positive for congestion and rhinorrhea.   Eyes: Negative for photophobia and visual disturbance.  Respiratory: Positive for cough and shortness of breath.   Cardiovascular: Positive for chest pain and leg swelling (symmetric, for several weeks).  Gastrointestinal: Negative for  nausea, vomiting, abdominal pain, diarrhea and constipation.  Genitourinary: Negative for dysuria, frequency and decreased urine volume.  Musculoskeletal: Positive for back pain (pain radiates to back). Negative for arthralgias, gait problem and myalgias.  Skin: Negative for color change and wound.  Neurological: Negative for dizziness, syncope, light-headedness and headaches.  Psychiatric/Behavioral: Negative for confusion and agitation.  All other systems reviewed and are negative.    Allergies  Codeine and Vicodin  Home Medications   Current Outpatient Rx  Name  Route  Sig  Dispense  Refill  . acetaminophen (TYLENOL) 500 MG tablet   Oral   Take 1,000 mg by mouth every 6 (six) hours as needed. For pain.         . calcium carbonate (TUMS - DOSED IN MG ELEMENTAL CALCIUM) 500 MG chewable tablet   Oral   Chew 2 tablets by mouth daily.         [name]. esomeprazole (NEXIUM) 40 MG capsule   Oral   Take 40 mg by mouth daily before breakfast.         . guaiFENesin (MUCINEX) 600 MG 12 hr tablet   Oral   Take 600 mg by mouth 2 (two) times daily.         [name]. Phenylephrine-APAP-Guaifenesin (TYLENOL SINUS CONGESTION/PAIN) 5-325-200 MG TABS   Oral   Take 1 tablet by mouth daily as needed (congestion / cough).  BP 122/56  Resp 23  SpO2 100% Physical Exam  Nursing note and vitals reviewed. Constitutional: She is oriented to person, place, and time. She appears well-developed and well-nourished. No distress.  HENT:  Head: Normocephalic and atraumatic.  Mouth/Throat: Oropharynx is clear and moist.  Eyes: Conjunctivae and EOM are normal. Pupils are equal, round, and reactive to light.  Neck: Normal range of motion. Neck supple.  Cardiovascular: Normal rate, regular rhythm, normal heart sounds and intact distal pulses.   Pulmonary/Chest: Breath sounds normal. Tachypnea noted. No respiratory distress.  Abdominal: Soft. Bowel sounds are normal. There is no tenderness. There is  no rebound and no guarding.  Gravid uterus, no tenderness   Musculoskeletal: Normal range of motion. She exhibits no edema and no tenderness.  Neurological: She is alert and oriented to person, place, and time. No cranial nerve deficit. Coordination normal.  Skin: Skin is warm and dry. No rash noted.  Psychiatric: She has a normal mood and affect. Her behavior is normal.    ED Course  Procedures (including critical care time) Labs Review Labs Reviewed  CBC WITH DIFFERENTIAL  COMPREHENSIVE METABOLIC PANEL  URINALYSIS, ROUTINE W REFLEX MICROSCOPIC  INFLUENZA PANEL BY PCR (TYPE A & B, H1N1)   Imaging Review Dg Chest Portable 1 View  07/09/2013   CLINICAL DATA:  Chest pain.  EXAM: PORTABLE CHEST - 1 VIEW  COMPARISON:  04/30/2010  FINDINGS: Lungs are clear. Cardiomediastinal silhouette and remainder the exam is unchanged.  IMPRESSION: No active disease.   Electronically Signed   By: Elberta Fortis M.D.   On: 07/09/2013 18:42    EKG Interpretation    Date/Time: 07/09/2013 1652   Ventricular Rate:   104 PR Interval:   102 QRS Duration:  81 QT Interval:   329 QTC Calculation:  433 R Axis:    71 Text Interpretation:  ST, normal axis. No diagnostic STE or depression. No previous for comparison.             MDM   33 yo F who is currently [redacted] weeks pregnant, presents with chest pain and respiratory symptoms. Mildly tachycardic, otherwise HD stable. Initial ECG shows SR without evidence of STD or elevation. Low suspicion for ACS. Some concern for PE given chest pain and SOB. Obtained CXR which was normal. Bilateral duplex without evidence of DVT. Lower suspicion for PE after negative duplex, plus her pain resolved and did not recur. Further she is not hypoxic. She does have influenza which is likely the cause of her SOB. OB RN evaluated the patient as well. Fetal heart rate noted to be in the 180's with diminished variability. The patient was given IVF's, oxygen therapy and position  changes. FHR continued to be elevated, I spoke with Dr. Rana Snare who recommended transfer to [facility] for further evaluation. I felt this was reasonable as Ms. Ruth Sims is unlikely to have a PE, no other emergent cause for her symptoms were identified. She is not currently in active labor and is safe for transfer. The patient was updated on plan, she was in agreement.   Reviewed imaging, ECG, labs and previous medical records, utilized in MDM  Discussed case with Dr. Micheline Maze.   Clinical Impression 1. Chest pain 2. Fetal tachycardia     Margie Billet, MD 07/11/13 [phone]

## 2013-07-09 NOTE — Anesthesia Preprocedure Evaluation (Signed)
Anesthesia Evaluation  Patient identified by MRN, date of birth, ID band Patient awake    Reviewed: Allergy & Precautions, H&P , NPO status , Patient's Chart, lab work & pertinent test results  Airway Mallampati: I TM Distance: >3 FB Neck ROM: full    Dental no notable dental hx.    Pulmonary former smoker,    Pulmonary exam normal       Cardiovascular negative cardio ROS      Neuro/Psych negative neurological ROS  negative psych ROS   GI/Hepatic Neg liver ROS,   Endo/Other  negative endocrine ROS  Renal/GU negative Renal ROS  negative genitourinary   Musculoskeletal   Abdominal Normal abdominal exam  (+)   Peds  Hematology negative hematology ROS (+)   Anesthesia Other Findings   Reproductive/Obstetrics (+) Pregnancy                           Anesthesia Physical Anesthesia Plan  ASA: II  Anesthesia Plan: Spinal   Post-op Pain Management:    Induction:   Airway Management Planned:   Additional Equipment:   Intra-op Plan:   Post-operative Plan:   Informed Consent: I have reviewed the patients History and Physical, chart, labs and discussed the procedure including the risks, benefits and alternatives for the proposed anesthesia with the patient or authorized representative who has indicated his/her understanding and acceptance.     Plan Discussed with: CRNA and Surgeon  Anesthesia Plan Comments:         Anesthesia Quick Evaluation

## 2013-07-10 ENCOUNTER — Encounter (HOSPITAL_COMMUNITY): Payer: Self-pay | Admitting: *Deleted

## 2013-07-10 LAB — CBC
HCT: 33.1 % — ABNORMAL LOW (ref 36.0–46.0)
Hemoglobin: 11.1 g/dL — ABNORMAL LOW (ref 12.0–15.0)
MCH: 31.9 pg (ref 26.0–34.0)
MCHC: 33.5 g/dL (ref 30.0–36.0)
MCV: 95.1 fL (ref 78.0–100.0)
Platelets: 167 10*3/uL (ref 150–400)
RBC: 3.48 MIL/uL — AB (ref 3.87–5.11)
RDW: 14.2 % (ref 11.5–15.5)
WBC: 9.8 10*3/uL (ref 4.0–10.5)

## 2013-07-10 LAB — ABO/RH: ABO/RH(D): B POS

## 2013-07-10 LAB — RPR: RPR: NONREACTIVE

## 2013-07-10 MED ORDER — DIPHENHYDRAMINE HCL 25 MG PO CAPS
25.0000 mg | ORAL_CAPSULE | Freq: Four times a day (QID) | ORAL | Status: DC | PRN
  Filled 2013-07-10: qty 1

## 2013-07-10 MED ORDER — OXYCODONE-ACETAMINOPHEN 5-325 MG PO TABS: 1.0000 | ORAL_TABLET | ORAL | Status: DC | PRN

## 2013-07-10 MED ORDER — NALOXONE HCL 0.4 MG/ML IJ SOLN: 0.4000 mg | INTRAMUSCULAR | Status: DC | PRN

## 2013-07-10 MED ORDER — ZOLPIDEM TARTRATE 5 MG PO TABS: 5.0000 mg | ORAL_TABLET | Freq: Every evening | ORAL | Status: DC | PRN

## 2013-07-10 MED ORDER — ONDANSETRON HCL 4 MG/2ML IJ SOLN: 4.0000 mg | INTRAMUSCULAR | Status: DC | PRN

## 2013-07-10 MED ORDER — SENNOSIDES-DOCUSATE SODIUM 8.6-50 MG PO TABS
2.0000 | ORAL_TABLET | ORAL | Status: DC
  Administered 2013-07-11 – 2013-07-12 (×2): 2 via ORAL
  Filled 2013-07-10 (×2): qty 2

## 2013-07-10 MED ORDER — ONDANSETRON HCL 4 MG/2ML IJ SOLN: 4.0000 mg | Freq: Three times a day (TID) | INTRAMUSCULAR | Status: DC | PRN

## 2013-07-10 MED ORDER — SIMETHICONE 80 MG PO CHEW: 80.0000 mg | CHEWABLE_TABLET | ORAL | Status: DC | PRN

## 2013-07-10 MED ORDER — SIMETHICONE 80 MG PO CHEW
80.0000 mg | CHEWABLE_TABLET | Freq: Three times a day (TID) | ORAL | Status: DC
  Administered 2013-07-10 – 2013-07-12 (×7): 80 mg via ORAL
  Filled 2013-07-10 (×7): qty 1

## 2013-07-10 MED ORDER — TRAMADOL HCL 50 MG PO TABS
50.0000 mg | ORAL_TABLET | Freq: Four times a day (QID) | ORAL | Status: DC | PRN
  Administered 2013-07-10 – 2013-07-12 (×7): 50 mg via ORAL
  Filled 2013-07-10 (×7): qty 1

## 2013-07-10 MED ORDER — LACTATED RINGERS IV SOLN
INTRAVENOUS | Status: DC
  Administered 2013-07-10: 10:00:00 via INTRAVENOUS

## 2013-07-10 MED ORDER — DIPHENHYDRAMINE HCL 50 MG/ML IJ SOLN: 12.5000 mg | INTRAMUSCULAR | Status: DC | PRN

## 2013-07-10 MED ORDER — METOCLOPRAMIDE HCL 5 MG/ML IJ SOLN
10.0000 mg | Freq: Three times a day (TID) | INTRAMUSCULAR | Status: DC | PRN
Start: 2013-07-10 — End: 2013-07-12

## 2013-07-10 MED ORDER — DIPHENHYDRAMINE HCL 50 MG/ML IJ SOLN: 25.0000 mg | INTRAMUSCULAR | Status: DC | PRN

## 2013-07-10 MED ORDER — DIPHENHYDRAMINE HCL 25 MG PO CAPS
25.0000 mg | ORAL_CAPSULE | ORAL | Status: DC | PRN
  Administered 2013-07-10 (×2): 25 mg via ORAL
  Filled 2013-07-10: qty 1

## 2013-07-10 MED ORDER — LANOLIN HYDROUS EX OINT: 1.0000 "application " | TOPICAL_OINTMENT | CUTANEOUS | Status: DC | PRN

## 2013-07-10 MED ORDER — PROMETHAZINE HCL 25 MG/ML IJ SOLN
INTRAMUSCULAR | Status: AC
  Administered 2013-07-10: 6.25 mg via INTRAVENOUS
  Filled 2013-07-10: qty 1

## 2013-07-10 MED ORDER — TETANUS-DIPHTH-ACELL PERTUSSIS 5-2.5-18.5 LF-MCG/0.5 IM SUSP: 0.5000 mL | Freq: Once | INTRAMUSCULAR | Status: DC

## 2013-07-10 MED ORDER — SODIUM CHLORIDE 0.9 % IJ SOLN: 3.0000 mL | INTRAMUSCULAR | Status: DC | PRN

## 2013-07-10 MED ORDER — WITCH HAZEL-GLYCERIN EX PADS: 1.0000 "application " | MEDICATED_PAD | CUTANEOUS | Status: DC | PRN

## 2013-07-10 MED ORDER — IBUPROFEN 600 MG PO TABS: 600.0000 mg | ORAL_TABLET | Freq: Four times a day (QID) | ORAL | Status: DC | PRN

## 2013-07-10 MED ORDER — SIMETHICONE 80 MG PO CHEW
80.0000 mg | CHEWABLE_TABLET | ORAL | Status: DC
  Administered 2013-07-11 – 2013-07-12 (×2): 80 mg via ORAL
  Filled 2013-07-10 (×2): qty 1

## 2013-07-10 MED ORDER — ONDANSETRON HCL 4 MG PO TABS: 4.0000 mg | ORAL_TABLET | ORAL | Status: DC | PRN

## 2013-07-10 MED ORDER — DIBUCAINE 1 % RE OINT: 1.0000 "application " | TOPICAL_OINTMENT | RECTAL | Status: DC | PRN

## 2013-07-10 MED ORDER — IBUPROFEN 600 MG PO TABS
600.0000 mg | ORAL_TABLET | Freq: Four times a day (QID) | ORAL | Status: DC
  Administered 2013-07-10 – 2013-07-12 (×9): 600 mg via ORAL
  Filled 2013-07-10 (×9): qty 1

## 2013-07-10 MED ORDER — DEXTROSE 5 % IV SOLN
1.0000 ug/kg/h | INTRAVENOUS | Status: DC | PRN
  Filled 2013-07-10: qty 2

## 2013-07-10 MED ORDER — PRENATAL MULTIVITAMIN CH
1.0000 | ORAL_TABLET | Freq: Every day | ORAL | Status: DC
  Administered 2013-07-10 – 2013-07-11 (×2): 1 via ORAL
  Filled 2013-07-10 (×2): qty 1

## 2013-07-10 MED ORDER — OXYTOCIN 40 UNITS IN LACTATED RINGERS INFUSION - SIMPLE MED: 62.5000 mL/h | INTRAVENOUS | Status: AC

## 2013-07-10 MED ORDER — MENTHOL 3 MG MT LOZG: 1.0000 | LOZENGE | OROMUCOSAL | Status: DC | PRN

## 2013-07-10 NOTE — Progress Notes (Signed)
Subjective: Postpartum Day 1: Cesarean Delivery Patient reports tolerating PO.  C/o itching  Objective: Vital signs in last 24 hours: Temp:  [97.5 F (36.4 C)-99.4 F (37.4 C)] 98.1 F (36.7 C) (01/26 0610) Pulse Rate:  [69-111] 70 (01/26 0610) Resp:  [16-34] 20 (01/26 0610) BP: (92-122)/(43-72) 109/63 mmHg (01/26 0610) SpO2:  [88 %-100 %] 99 % (01/26 0610) Weight:  [166 lb (75.297 kg)] 166 lb (75.297 kg) (01/25 2145)  Physical Exam:  General: alert and cooperative Lochia: appropriate Uterine Fundus: firm Incision: honeycomb dressing CDI DVT Evaluation: No evidence of DVT seen on physical exam. Negative Homan's sign. No cords or calf tenderness. No significant calf/ankle edema.   Recent Labs  07/09/13 2210 07/10/13 0550  HGB 11.2* 11.1*  HCT 32.5* 33.1*    Assessment/Plan: Status post Cesarean section. Postoperative course complicated by influenza  Continue current care.  [name] 07/10/2013, 8:15 AM

## 2013-07-10 NOTE — Progress Notes (Signed)
o2 sat 88 while she is sleeping/o2 started at [employer]

## 2013-07-10 NOTE — Progress Notes (Signed)
Ur chart review completed per request.  

## 2013-07-10 NOTE — Progress Notes (Signed)
Patient was referred for history of depression/anxiety. * Referral screened out by Clinical Social Worker because none of the following criteria appear to apply:  ~ History of anxiety/depression during this pregnancy, or of post-partum depression.  ~ Diagnosis of (anxiety) and/or depression within last 6 years, per pt.  ~ History of depression due to pregnancy loss/loss of child  OR * Patient's symptoms currently being treated with medication and/or therapy.  Please contact the Clinical Social Worker if needs arise, or by the patient's request. Pt was diagnosed with PTSD after deployment from [place].  She received therapy & explained that her diagnoses of PTSD was changed to anxiety in 2009.  She has not experienced any anxious symptoms since then & reports feeling okay.  She denies any history of depression.

## 2013-07-10 NOTE — Anesthesia Postprocedure Evaluation (Signed)
Anesthesia Post Note  Patient: [name]  Procedure(s) Performed: Procedure(s) (LRB): CESAREAN SECTION (N/A)  Anesthesia type: SAB  Patient location: Mother/Baby  Post pain: Pain level controlled  Post assessment: Post-op Vital signs reviewed  Last Vitals:  Filed Vitals:   07/10/13 0610  BP: 109/63  Pulse: 70  Temp: 36.7 C  Resp: 20    Post vital signs: Reviewed  Level of consciousness: awake  Complications: No apparent anesthesia complications

## 2013-07-10 NOTE — Anesthesia Postprocedure Evaluation (Signed)
Anesthesia Post Note  Patient: [name]  Procedure(s) Performed: Procedure(s) (LRB): CESAREAN SECTION (N/A)  Anesthesia type: Spinal  Patient location: PACU  Post pain: Pain level controlled  Post assessment: Post-op Vital signs reviewed  Last Vitals:  Filed Vitals:   07/09/13 2345  BP: 105/57  Pulse: 74  Temp:   Resp: 20    Post vital signs: Reviewed  Level of consciousness: awake  Complications: No apparent anesthesia complications

## 2013-07-11 NOTE — Progress Notes (Signed)
Subjective: Postpartum Day 2: Cesarean Delivery Patient reports tolerating PO, + flatus and no problems voiding.    Objective: Vital signs in last 24 hours: Temp:  [97.9 F (36.6 C)-98.8 F (37.1 C)] 97.9 F (36.6 C) (01/27 0435) Pulse Rate:  [62-81] 81 (01/27 0435) Resp:  [16-18] 16 (01/27 0435) BP: (103-118)/(59-82) 116/82 mmHg (01/27 0435) SpO2:  [95 %-100 %] 99 % (01/27 0435)  Physical Exam:  General: alert and cooperative Lochia: appropriate Uterine Fundus: firm Incision: honeycomb dressing CDI, staples appear intact DVT Evaluation: No evidence of DVT seen on physical exam. Negative Homan's sign. No cords or calf tenderness. No significant calf/ankle edema.   Recent Labs  07/09/13 2210 07/10/13 0550  HGB 11.2* 11.1*  HCT 32.5* 33.1*    Assessment/Plan: Status post Cesarean section. Postoperative course complicated by influenza  Encouraged mother of patient to contact her PCP for Tamiflu.  Alioune Hodgkin G 07/11/2013, 8:19 AM

## 2013-07-11 NOTE — ED Provider Notes (Signed)
Medical screening examination/treatment/procedure(s) were conducted as a shared visit with resident-physician practitioner(s) and myself.  I personally evaluated the patient during the encounter.  Pt is a 33 y.o. female with pmhx as above at [redacted]wks gestation presenting with CP, SOB, cough, congestion.  Pt found to have mild tachycardic, bu VS otherwise appropriate for gestational age. No risk factors for ACS. Risk factors for PE include pregnancy, but CXR nml, BLLE US w/o evidence of DVT, and nml O2 sats.  In consultations w/ OB and OB nurse, pt having contractions with elevated FHT, decreased variability despite IVF, LL decubitus position.  Pt not in active stage of labor and [facility]    EKG Interpretation    Date/Time:  Sunday July 09 2013 16:52:56 EST Ventricular Rate:  104 PR Interval:  102 QRS Duration: 81 QT Interval:  329 QTC Calculation: 433 R Axis:   71 Text Interpretation:  Sinus tachycardia ED PHYSICIAN INTERPRETATION AVAILABLE IN [facility] Confirmed by TEST, RECORD ([id number]) on 07/11/2013 9:27:57 AM             Shanna CiscoMegan E Docherty, MD 07/11/13 1126

## 2013-07-12 MED ORDER — TRAMADOL HCL 50 MG PO TABS: 50.0000 mg | ORAL_TABLET | Freq: Four times a day (QID) | ORAL | Status: DC | PRN

## 2013-07-12 MED ORDER — IBUPROFEN 600 MG PO TABS: 600.0000 mg | ORAL_TABLET | Freq: Four times a day (QID) | ORAL | Status: DC

## 2013-07-12 MED ORDER — OSELTAMIVIR PHOSPHATE 75 MG PO CAPS: 75.0000 mg | ORAL_CAPSULE | Freq: Two times a day (BID) | ORAL | Status: DC

## 2013-07-12 NOTE — Discharge Summary (Signed)
Obstetric Discharge Summary Reason for Admission: influenza at term and non-reassuring fetal heart tones Prenatal Procedures: ultrasound Intrapartum Procedures: cesarean: low cervical, transverse Postpartum Procedures: none Complications-Operative and Postpartum: influenza Hemoglobin  Date Value Range Status  07/10/2013 11.1* 12.0 - 15.0 g/dL Final  1/61/09607/14/2014 [id number]   Final     HCT  Date Value Range Status  07/10/2013 33.1* 36.0 - 46.0 % Final  12/26/2012 37   Final    Physical Exam:  General: alert and cooperative Lochia: appropriate Uterine Fundus: firm Incision: healing well, staples removed DVT Evaluation: No evidence of DVT seen on physical exam. Negative Homan's sign. No cords or calf tenderness. No significant calf/ankle edema.  Discharge Diagnoses: Term Pregnancy-delivered  Discharge Information: Date: 07/12/2013 Activity: pelvic rest Diet: routine Medications: PNV, Ibuprofen and ultram and tamiflu Condition: improved Instructions: refer to practice specific booklet Discharge to: home   Newborn Data: Live born female  Birth Weight: 7 lb 4.2 oz (3294 g) APGAR: 8, 9  Home with mother.  [name] G 07/12/2013, 8:37 AM

## 2013-07-20 ENCOUNTER — Other Ambulatory Visit (HOSPITAL_COMMUNITY): Payer: Managed Care, Other (non HMO)

## 2013-07-21 ENCOUNTER — Encounter (HOSPITAL_COMMUNITY)
Admission: AD | Disposition: A | Discharge status: Home / Self Care | Payer: Self-pay | Source: Ambulatory Visit | Attending: Obstetrics and Gynecology

## 2013-07-21 SURGERY — Surgical Case
Anesthesia: Regional

## 2014-04-16 ENCOUNTER — Encounter (HOSPITAL_COMMUNITY): Payer: Self-pay | Admitting: *Deleted

## 2015-07-23 ENCOUNTER — Other Ambulatory Visit: Payer: Self-pay | Admitting: Occupational Medicine

## 2015-07-23 ENCOUNTER — Ambulatory Visit: Payer: Self-pay

## 2015-07-23 DIAGNOSIS — R52 Pain, unspecified: Secondary | ICD-10-CM

## 2015-11-27 ENCOUNTER — Encounter (HOSPITAL_COMMUNITY): Payer: Self-pay | Admitting: Emergency Medicine

## 2015-11-27 ENCOUNTER — Ambulatory Visit (HOSPITAL_COMMUNITY)
Admission: EM | Admit: 2015-11-27 | Discharge: 2015-11-27 | Disposition: A | Discharge status: Home / Self Care | Payer: BLUE CROSS/BLUE SHIELD | Attending: Emergency Medicine | Admitting: Emergency Medicine

## 2015-11-27 ENCOUNTER — Ambulatory Visit (HOSPITAL_COMMUNITY): Payer: BLUE CROSS/BLUE SHIELD

## 2015-11-27 DIAGNOSIS — R05 Cough: Secondary | ICD-10-CM | POA: Diagnosis present

## 2015-11-27 DIAGNOSIS — J069 Acute upper respiratory infection, unspecified: Secondary | ICD-10-CM | POA: Diagnosis not present

## 2015-11-27 MED ORDER — AZITHROMYCIN 250 MG PO TABS: 1000.0000 mg | ORAL_TABLET | Freq: Once | ORAL | Status: DC

## 2015-11-27 MED ORDER — CEFTRIAXONE SODIUM 250 MG IJ SOLR: 250.0000 mg | Freq: Once | INTRAMUSCULAR | Status: DC

## 2015-11-27 MED ORDER — BENZONATATE 100 MG PO CAPS
100.0000 mg | ORAL_CAPSULE | Freq: Three times a day (TID) | ORAL | Status: DC
Start: 2015-11-27 — End: 2016-05-26

## 2015-11-27 MED ORDER — AZITHROMYCIN 250 MG PO TABS: 250.0000 mg | ORAL_TABLET | Freq: Every day | ORAL | Status: DC

## 2015-11-27 NOTE — Discharge Instructions (Signed)
You may take 400-600mg Ibuprofen (Motrin) every 6-8 hours for fever and pain  °Alternate with Tylenol  °You may take 500mg Tylenol every 4-6 hours as needed for fever and pain  °Follow-up with your primary care provider next week for recheck of symptoms if not improving.  °Be sure to drink plenty of fluids and rest, at least 8hrs of sleep a night, preferably more while you are sick. °Return urgent care or go to closest ER if you cannot keep down fluids/signs of dehydration, fever not reducing with Tylenol, difficulty breathing/wheezing, stiff neck, worsening condition, or other concerns (see below)  °Please take antibiotics as prescribed and be sure to complete entire course even if you start to feel better to ensure infection does not come back. ° ° °Cool Mist Vaporizers °Vaporizers may help relieve the symptoms of a cough and cold. They add moisture to the air, which helps mucus to become thinner and less sticky. This makes it easier to breathe and cough up secretions. Cool mist vaporizers do not cause serious burns like hot mist vaporizers, which may also be called steamers or humidifiers. Vaporizers have not been proven to help with colds. You should not use a vaporizer if you are allergic to mold. °HOME CARE INSTRUCTIONS °· Follow the package instructions for the vaporizer. °· Do not use anything other than distilled water in the vaporizer. °· Do not run the vaporizer all of the time. This can cause mold or bacteria to grow in the vaporizer. °· Clean the vaporizer after each time it is used. °· Clean and dry the vaporizer well before storing it. °· Stop using the vaporizer if worsening respiratory symptoms develop. °  °This information is not intended to replace advice given to you by your health care provider. Make sure you discuss any questions you have with your health care provider. °  °Document Released: 02/27/2004 Document Revised: 06/06/2013 Document Reviewed: 10/19/2012 °Elsevier Interactive Patient  Education ©2016 [employer] ° °Cough, Adult °A cough helps to clear your throat and lungs. A cough may last only 2-3 weeks (acute), or it may last longer than 8 weeks (chronic). Many different things can cause a cough. A cough may be a sign of an illness or another medical condition. °HOME CARE °· Pay attention to any changes in your cough. °· Take medicines only as told by your doctor. °¨ If you were prescribed an antibiotic medicine, take it as told by your doctor. Do not stop taking it even if you start to feel better. °¨ Talk with your doctor before you try using a cough medicine. °· Drink enough fluid to keep your pee (urine) clear or pale yellow. °· If the air is dry, use a cold steam vaporizer or humidifier in your home. °· Stay away from things that make you cough at work or at home. °· If your cough is worse at night, try using extra pillows to raise your head up higher while you sleep. °· Do not smoke, and try not to be around smoke. If you need help quitting, ask your doctor. °· Do not have caffeine. °· Do not drink alcohol. °· Rest as needed. °GET HELP IF: °· You have new problems (symptoms). °· You cough up yellow fluid (pus). °· Your cough does not get better after 2-3 weeks, or your cough gets worse. °· Medicine does not help your cough and you are not sleeping well. °· You have pain that gets worse or pain that is not helped with medicine. °·   You have a fever. °· You are losing weight and you do not know why. °· You have night sweats. °GET HELP RIGHT AWAY IF: °· You cough up blood. °· You have trouble breathing. °· Your heartbeat is very fast. °  °This information is not intended to replace advice given to you by your health care provider. Make sure you discuss any questions you have with your health care provider. °  °Document Released: 02/12/2011 Document Revised: 02/20/2015 Document Reviewed: 08/08/2014 °Elsevier Interactive Patient Education ©2016 [employer]. ° °Upper Respiratory Infection,  Adult °Most upper respiratory infections (URIs) are caused by a virus. A URI affects the nose, throat, and upper air passages. The most common type of URI is often called "the common cold." °HOME CARE  °· Take medicines only as told by your doctor. °· Gargle warm saltwater or take cough drops to comfort your throat as told by your doctor. °· Use a warm mist humidifier or inhale steam from a shower to increase air moisture. This may make it easier to breathe. °· Drink enough fluid to keep your pee (urine) clear or pale yellow. °· Eat soups and other clear broths. °· Have a healthy diet. °· Rest as needed. °· Go back to work when your fever is gone or your doctor says it is okay. °¨ You may need to stay home longer to avoid giving your URI to others. °¨ You can also wear a face mask and wash your hands often to prevent spread of the virus. °· Use your inhaler more if you have asthma. °· Do not use any tobacco products, including cigarettes, chewing tobacco, or electronic cigarettes. If you need help quitting, ask your doctor. °GET HELP IF: °· You are getting worse, not better. °· Your symptoms are not helped by medicine. °· You have chills. °· You are getting more short of breath. °· You have brown or red mucus. °· You have yellow or brown discharge from your nose. °· You have pain in your face, especially when you bend forward. °· You have a fever. °· You have puffy (swollen) neck glands. °· You have pain while swallowing. °· You have white areas in the back of your throat. °GET HELP RIGHT AWAY IF:  °· You have very bad or constant: °¨ Headache. °¨ Ear pain. °¨ Pain in your forehead, behind your eyes, and over your cheekbones (sinus pain). °¨ Chest pain. °· You have long-lasting (chronic) lung disease and any of the following: °¨ Wheezing. °¨ Long-lasting cough. °¨ Coughing up blood. °¨ A change in your usual mucus. °· You have a stiff neck. °· You have changes in  your: °¨ Vision. °¨ Hearing. °¨ Thinking. °¨ Mood. °MAKE SURE YOU:  °· Understand these instructions. °· Will watch your condition. °· Will get help right away if you are not doing well or get worse. °  °This information is not intended to replace advice given to you by your health care provider. Make sure you discuss any questions you have with your health care provider. °  °Document Released: 11/18/2007 Document Revised: 10/16/2014 Document Reviewed: 09/06/2013 °Elsevier Interactive Patient Education ©2016 [employer] Inc. ° °

## 2015-11-27 NOTE — ED Notes (Signed)
PT reports she has facial pain and pressure, productive cough with green mucus, SOB, and intermittent chest pain. PT reports these symptoms started 4-5 days ago

## 2015-11-27 NOTE — ED Provider Notes (Signed)
CSN: [id number]     Arrival date & time 11/27/15  1612 History   First MD Initiated Contact with Patient 11/27/15 1747     Chief Complaint  Patient presents with  . Facial Pain  . Cough   (Consider location/radiation/quality/duration/timing/severity/associated sxs/prior Treatment) HPI  [name] is a 35 y.o. female presenting to UC with c/o 4-5 days of gradually worsening facial pain and pressure, associated productive cough with green mucous and mild intermittent SOB with chest pain. No sick contacts or recent travel. Symptoms are moderate in severity. No improvement with OTC alka-seltzer cold or Tylenol cold & sinus. Denies fever, chills, n/v/d. No hx of asthma.   Past Medical History  Diagnosis Date  . Anemia   . Irregular heartbeat    Past Surgical History  Procedure Laterality Date  . Tendon reconstruction      Right thumb  . Breast biopsy      Right breast  . Cesarean section N/A 07/09/2013    Procedure: CESAREAN SECTION;  Surgeon: Turner Daniels, MD;  Location: [facility];  Service: Obstetrics;  Laterality: N/A;   No family history on file. Social History  Substance Use Topics  . Smoking status: Former Games developer  . Smokeless tobacco: None  . Alcohol Use: Yes     Comment: occasional   OB History    Gravida Para Term Preterm AB TAB SAB Ectopic Multiple Living   Review of Systems  Constitutional: Negative for fever and chills.  HENT: Positive for congestion and sinus pressure. Negative for ear pain, sore throat, trouble swallowing and voice change.   Respiratory: Positive for cough. Negative for shortness of breath.   Cardiovascular: Negative for chest pain and palpitations.  Gastrointestinal: Negative for nausea, vomiting, abdominal pain and diarrhea.  Musculoskeletal: Negative for myalgias, back pain and arthralgias.  Skin: Negative for rash.  Neurological: Positive for headaches. Negative for dizziness and light-headedness.    Allergies   Codeine; Oxycodone; and Vicodin  Home Medications   Prior to Admission medications   Medication Sig Start Date End Date Taking? Authorizing Provider  esomeprazole (NEXIUM) 40 MG capsule Take 40 mg by mouth daily at 12 noon.   Yes Historical Provider, MD  acetaminophen (TYLENOL) 500 MG tablet Take 1,000 mg by mouth every 6 (six) hours as needed. For pain.    Historical Provider, MD  azithromycin (ZITHROMAX) 250 MG tablet Take 1 tablet (250 mg total) by mouth daily. Take first 2 tablets together, then 1 every day until finished. 11/27/15   Junius Finner, PA-C  benzonatate (TESSALON) 100 MG capsule Take 1-2 capsules (100-200 mg total) by mouth every 8 (eight) hours. 11/27/15   Junius Finner, PA-C  ibuprofen (ADVIL,MOTRIN) 600 MG tablet Take 1 tablet (600 mg total) by mouth every 6 (six) hours. 07/12/13   Julio Sicks, NP  oseltamivir (TAMIFLU) 75 MG capsule Take 1 capsule (75 mg total) by mouth 2 (two) times daily. 07/12/13   Julio Sicks, NP  traMADol (ULTRAM) 50 MG tablet Take 1 tablet (50 mg total) by mouth every 6 (six) hours as needed for moderate pain. 07/12/13   Julio Sicks, NP   Meds Ordered and Administered this Visit  Medications - No data to display  BP 113/70 mmHg  Pulse 76  Temp(Src) 98.8 F (37.1 C) (Oral)  SpO2 100%  LMP 11/11/2015 No data found.   Physical Exam  Constitutional: She appears well-developed and well-nourished. No  distress.  HENT:  Head: Normocephalic and atraumatic.  Right Ear: Tympanic membrane normal.  Left Ear: Tympanic membrane normal.  Nose: Mucosal edema present. Right sinus exhibits maxillary sinus tenderness and frontal sinus tenderness. Left sinus exhibits maxillary sinus tenderness and frontal sinus tenderness.  Mouth/Throat: Uvula is midline, oropharynx is clear and moist and mucous membranes are normal.  Eyes: Conjunctivae are normal. No scleral icterus.  Neck: Normal range of motion. Neck supple.  Cardiovascular: Normal rate, regular rhythm  and normal heart sounds.   Pulmonary/Chest: Effort normal and breath sounds normal. No stridor. No respiratory distress. She has no wheezes. She has no rales. She exhibits no tenderness.  Abdominal: Soft. She exhibits no distension. There is no tenderness.  Musculoskeletal: Normal range of motion.  Lymphadenopathy:    She has no cervical adenopathy.  Neurological: She is alert.  Skin: Skin is warm and dry. She is not diaphoretic.  Nursing note and vitals reviewed.   ED Course  Procedures (including critical care time)  Labs Review Labs Reviewed - No data to display  Imaging Review Dg Chest 2 View  11/27/2015  CLINICAL DATA:  Acute onset of cough, shortness of breath and congestion. Burning behind the eyes and nose. Initial encounter. EXAM: CHEST  2 VIEW COMPARISON:  Chest radiograph from 07/09/2013 FINDINGS: The lungs are well-aerated. Mild peribronchial thickening is noted. There is no evidence of focal opacification, pleural effusion or pneumothorax. The heart is normal in size; the mediastinal contour is within normal limits. No acute osseous abnormalities are seen. IMPRESSION: Mild peribronchial thickening noted.  Lungs otherwise grossly clear. Electronically Signed   By: Roanna RaiderJeffery  Chang M.D.   On: 11/27/2015 18:57      MDM   1. Upper respiratory infection    Pt c/o worsening URI symptoms with thick green mucous.  CXR c/w mild peribronchial thickening, otherwise clear.  O2 Sat 100% on RA  Will cover for bacterial infection Rx: azithromycin and tessalon  Advised pt to use acetaminophen and ibuprofen as needed for fever and pain. Encouraged rest and fluids. F/u with PCP in 7-10 days if not improving, sooner if worsening. Pt verbalized understanding and agreement with tx plan.     Junius FinnerErin O'Malley, PA-C 11/27/15 2000

## 2016-02-11 LAB — OB RESULTS CONSOLE RPR: RPR: NONREACTIVE

## 2016-02-11 LAB — OB RESULTS CONSOLE ABO/RH: RH Type: POSITIVE

## 2016-02-11 LAB — OB RESULTS CONSOLE GC/CHLAMYDIA
Chlamydia: NEGATIVE
Gonorrhea: NEGATIVE

## 2016-02-11 LAB — OB RESULTS CONSOLE HIV ANTIBODY (ROUTINE TESTING): HIV: NONREACTIVE

## 2016-02-11 LAB — OB RESULTS CONSOLE ANTIBODY SCREEN: Antibody Screen: NEGATIVE

## 2016-02-11 LAB — OB RESULTS CONSOLE RUBELLA ANTIBODY, IGM: RUBELLA: IMMUNE

## 2016-02-11 LAB — OB RESULTS CONSOLE HEPATITIS B SURFACE ANTIGEN: Hepatitis B Surface Ag: NEGATIVE

## 2016-05-26 ENCOUNTER — Inpatient Hospital Stay (HOSPITAL_COMMUNITY)
Admission: AD | Admit: 2016-05-26 | Discharge: 2016-05-26 | Disposition: A | Discharge status: Home / Self Care | Payer: Managed Care, Other (non HMO) | Source: Ambulatory Visit | Attending: Obstetrics and Gynecology | Admitting: Obstetrics and Gynecology

## 2016-05-26 ENCOUNTER — Encounter (HOSPITAL_COMMUNITY): Payer: Self-pay | Admitting: *Deleted

## 2016-05-26 DIAGNOSIS — R112 Nausea with vomiting, unspecified: Secondary | ICD-10-CM | POA: Insufficient documentation

## 2016-05-26 DIAGNOSIS — Z9889 Other specified postprocedural states: Secondary | ICD-10-CM | POA: Insufficient documentation

## 2016-05-26 DIAGNOSIS — E876 Hypokalemia: Secondary | ICD-10-CM

## 2016-05-26 DIAGNOSIS — Z3A24 24 weeks gestation of pregnancy: Secondary | ICD-10-CM

## 2016-05-26 DIAGNOSIS — Z87891 Personal history of nicotine dependence: Secondary | ICD-10-CM | POA: Diagnosis not present

## 2016-05-26 DIAGNOSIS — O99282 Endocrine, nutritional and metabolic diseases complicating pregnancy, second trimester: Secondary | ICD-10-CM | POA: Diagnosis not present

## 2016-05-26 DIAGNOSIS — E860 Dehydration: Secondary | ICD-10-CM

## 2016-05-26 DIAGNOSIS — Z79899 Other long term (current) drug therapy: Secondary | ICD-10-CM | POA: Insufficient documentation

## 2016-05-26 DIAGNOSIS — Z888 Allergy status to other drugs, medicaments and biological substances status: Secondary | ICD-10-CM | POA: Diagnosis not present

## 2016-05-26 DIAGNOSIS — O219 Vomiting of pregnancy, unspecified: Secondary | ICD-10-CM

## 2016-05-26 LAB — COMPREHENSIVE METABOLIC PANEL
ALBUMIN: 3.2 g/dL — AB (ref 3.5–5.0)
ALT: 14 U/L (ref 14–54)
AST: 22 U/L (ref 15–41)
Alkaline Phosphatase: 45 U/L (ref 38–126)
Anion gap: 8 (ref 5–15)
BILIRUBIN TOTAL: 0.6 mg/dL (ref 0.3–1.2)
BUN: 10 mg/dL (ref 6–20)
CO2: 22 mmol/L (ref 22–32)
Calcium: 8.1 mg/dL — ABNORMAL LOW (ref 8.9–10.3)
Chloride: 102 mmol/L (ref 101–111)
Creatinine, Ser: 0.54 mg/dL (ref 0.44–1.00)
GFR calc Af Amer: >60 mL/min (ref >60)
GFR calc non Af Amer: >60 mL/min (ref >60)
GLUCOSE: 108 mg/dL — AB (ref 65–99)
POTASSIUM: 3.2 mmol/L — AB (ref 3.5–5.1)
SODIUM: 132 mmol/L — AB (ref 135–145)
TOTAL PROTEIN: 6.6 g/dL (ref 6.5–8.1)

## 2016-05-26 LAB — URINALYSIS, ROUTINE W REFLEX MICROSCOPIC
Bilirubin Urine: NEGATIVE
Glucose, UA: NEGATIVE mg/dL
HGB URINE DIPSTICK: NEGATIVE
Ketones, ur: NEGATIVE mg/dL
LEUKOCYTES UA: NEGATIVE
NITRITE: NEGATIVE
PROTEIN: 30 mg/dL — AB
Specific Gravity, Urine: 1.027 (ref 1.005–1.030)
pH: 6 (ref 5.0–8.0)

## 2016-05-26 LAB — CBC
HCT: 33.2 % — ABNORMAL LOW (ref 36.0–46.0)
Hemoglobin: 11.4 g/dL — ABNORMAL LOW (ref 12.0–15.0)
MCH: 32.4 pg (ref 26.0–34.0)
MCHC: 34.3 g/dL (ref 30.0–36.0)
MCV: 94.3 fL (ref 78.0–100.0)
Platelets: 201 10*3/uL (ref 150–400)
RBC: 3.52 MIL/uL — ABNORMAL LOW (ref 3.87–5.11)
RDW: 14.9 % (ref 11.5–15.5)
WBC: 6.9 10*3/uL (ref 4.0–10.5)

## 2016-05-26 NOTE — MAU Provider Note (Signed)
Chief Complaint:  Dizziness and Headache   First Provider Initiated Contact with Patient 05/26/16 1317      HPI: [name] is a 35 y.o. G2P1001 at 3924w4dwho presents to maternity admissions reporting she had GI virus over the weekend with n/v/d, then felt better yesterday but after eating this morning had episode of dizziness, nausea, and h/a.  She is drinking water and sports drinks and these are helping her symptoms. She reports the dizziness and nausea have resolved but a mild h/a remains.  She has not tried any medications.  She is tolerating PO food and fluids well today. She reports good fetal movement, denies regular contractions/cramping, LOF, vaginal bleeding, vaginal itching/burning, urinary symptoms,  n/v, or fever/chills.    HPI  Past Medical History: Past Medical History:  Diagnosis Date  . Anemia   . Irregular heartbeat     Past obstetric history: OB History  Gravida Para Term Preterm AB Living  2 1 1     1   SAB TAB Ectopic Multiple Live Births          1    # Outcome Date GA Lbr Len/2nd Weight Sex Delivery Anes PTL Lv  2 Current           1 Term 07/09/13 [id number]  7 lb 4.2 oz (3.294 kg) F CS-LTranv Spinal  LIV      Past Surgical History: Past Surgical History:  Procedure Laterality Date  . BREAST BIOPSY     Right breast  . BREAST BIOPSY  2003  . CESAREAN SECTION N/A 07/09/2013   Procedure: CESAREAN SECTION;  Surgeon: Turner Danielsavid C Lowe, MD;  Location: [facility];  Service: Obstetrics;  Laterality: N/A;  . TENDON RECONSTRUCTION     Right thumb    Family History: History reviewed. No pertinent family history.  Social History: Social History  Substance Use Topics  . Smoking status: Former Games developermoker  . Smokeless tobacco: Never Used  . Alcohol use Yes     Comment: occasional    Allergies:  Allergies  Allergen Reactions  . Codeine Nausea And Vomiting  . Oxycodone   . Vicodin [Hydrocodone-Acetaminophen]     Meds:  Prescriptions Prior to Admission   Medication Sig Dispense Refill Last Dose  . acetaminophen (TYLENOL) 500 MG tablet Take 1,000 mg by mouth every 6 (six) hours as needed. For pain.   05/25/2016 at Unknown time  . esomeprazole (NEXIUM) 40 MG capsule Take 40 mg by mouth daily at 12 noon.   05/26/2016 at Unknown time  . azithromycin (ZITHROMAX) 250 MG tablet Take 1 tablet (250 mg total) by mouth daily. Take first 2 tablets together, then 1 every day until finished. 6 tablet 0   . benzonatate (TESSALON) 100 MG capsule Take 1-2 capsules (100-200 mg total) by mouth every 8 (eight) hours. 21 capsule 0   . ibuprofen (ADVIL,MOTRIN) 600 MG tablet Take 1 tablet (600 mg total) by mouth every 6 (six) hours. 30 tablet 1   . oseltamivir (TAMIFLU) 75 MG capsule Take 1 capsule (75 mg total) by mouth 2 (two) times daily. 14 capsule 0   . traMADol (ULTRAM) 50 MG tablet Take 1 tablet (50 mg total) by mouth every 6 (six) hours as needed for moderate pain. 30 tablet 0     ROS:  Review of Systems  Constitutional: Negative for chills, fatigue and fever.  HENT: Negative for sinus pressure.   Eyes: Negative for photophobia.  Respiratory: Negative for shortness of breath.   Cardiovascular: Negative  for chest pain.  Gastrointestinal: Negative for constipation, diarrhea, nausea and vomiting.  Genitourinary: Negative for difficulty urinating, dysuria, flank pain, frequency, pelvic pain, vaginal bleeding, vaginal discharge and vaginal pain.  Musculoskeletal: Negative for neck pain.  Neurological: Negative for dizziness, weakness and headaches.  Psychiatric/Behavioral: Negative.      I have reviewed patient's Past Medical Hx, Surgical Hx, Family Hx, Social Hx, medications and allergies.   Physical Exam  Patient Vitals for the past 24 hrs:  BP Temp Temp src Pulse Resp Weight  05/26/16 1151 117/62 98.2 F (36.8 C) Oral 90 16 127 lb 3.2 oz (57.7 kg)   Constitutional: Well-developed, well-nourished female in no acute distress.  Cardiovascular: normal  rate Respiratory: normal effort GI: Abd soft, non-tender, gravid appropriate for gestational age.  MS: Extremities nontender, no edema, normal ROM Neurologic: Alert and oriented x 4.  GU: Neg CVAT.    FHT:  Baseline 150, moderate variability, accelerations present, no decelerations Contractions: None on toco or to palpation   Labs: Results for orders placed or performed during the hospital encounter of 05/26/16 (from the past 24 hour(s))  Urinalysis, Routine w reflex microscopic     Status: Abnormal   Collection Time: 05/26/16 11:40 AM  Result Value Ref Range   Color, Urine AMBER (A) YELLOW   APPearance HAZY (A) CLEAR   Specific Gravity, Urine 1.027 1.005 - 1.030   pH 6.0 5.0 - 8.0   Glucose, UA NEGATIVE NEGATIVE mg/dL   Hgb urine dipstick NEGATIVE NEGATIVE   Bilirubin Urine NEGATIVE NEGATIVE   Ketones, ur NEGATIVE NEGATIVE mg/dL   Protein, ur 30 (A) NEGATIVE mg/dL   Nitrite NEGATIVE NEGATIVE   Leukocytes, UA NEGATIVE NEGATIVE   RBC / HPF 0-5 0 - 5 RBC/hpf   WBC, UA 0-5 0 - 5 WBC/hpf   Bacteria, UA RARE (A) NONE SEEN   Squamous Epithelial / LPF 0-5 (A) NONE SEEN   Mucous PRESENT   CBC     Status: Abnormal   Collection Time: 05/26/16 12:25 PM  Result Value Ref Range   WBC 6.9 4.0 - 10.5 K/uL   RBC 3.52 (L) 3.87 - 5.11 MIL/uL   Hemoglobin 11.4 (L) 12.0 - 15.0 g/dL   HCT 19.1 (L) 47.8 - 29.5 %   MCV 94.3 78.0 - 100.0 fL   MCH 32.4 26.0 - 34.0 pg   MCHC 34.3 30.0 - 36.0 g/dL   RDW 62.1 30.8 - 65.7 %   Platelets 201 150 - 400 K/uL  Comprehensive metabolic panel     Status: Abnormal   Collection Time: 05/26/16 12:25 PM  Result Value Ref Range   Sodium 132 (L) 135 - 145 mmol/L   Potassium 3.2 (L) 3.5 - 5.1 mmol/L   Chloride 102 101 - 111 mmol/L   CO2 22 22 - 32 mmol/L   Glucose, Bld 108 (H) 65 - 99 mg/dL   BUN 10 6 - 20 mg/dL   Creatinine, Ser 8.46 0.44 - 1.00 mg/dL   Calcium 8.1 (L) 8.9 - 10.3 mg/dL   Total Protein 6.6 6.5 - 8.1 g/dL   Albumin 3.2 (L) 3.5 - 5.0 g/dL    AST 22 15 - 41 U/L   ALT 14 14 - 54 U/L   Alkaline Phosphatase 45 38 - 126 U/L   Total Bilirubin 0.6 0.3 - 1.2 mg/dL   GFR calc non Af Amer >60 >60 mL/min   GFR calc Af Amer >60 >60 mL/min   Anion gap 8 5 - 15  Imaging:  No results found.  MAU Course/MDM: I have ordered labs and reviewed results.  NST reviewed Consult Dr Vincente PoliGrewal with presentation, exam findings and test results.  Treatments in MAU included IV fluids, Phenergan.  Pt felt better after fluids/meds and tolerated PO food/fluids. Pt to eat 2-3 bananas daily for next few days to increase Potassium.  F/U in office as scheduled. Pt stable at time of discharge.   Assessment: 1. Mild dehydration   2. Hypokalemia     Plan: Discharge home Labor precautions and fetal kick counts Follow-up Information    ADKINS,GRETCHEN, MD Follow up.   Specialty:  Obstetrics and Gynecology Why:  As scheduled, return to MAU as needed for emergencies Contact information: [address], [address] [place] [id number] [phone]            Medication List    STOP taking these medications   azithromycin 250 MG tablet Commonly known as:  ZITHROMAX   benzonatate 100 MG capsule Commonly known as:  TESSALON   ibuprofen 600 MG tablet Commonly known as:  ADVIL,MOTRIN   oseltamivir 75 MG capsule Commonly known as:  TAMIFLU   traMADol 50 MG tablet Commonly known as:  ULTRAM     TAKE these medications   acetaminophen 500 MG tablet Commonly known as:  TYLENOL Take 1,000 mg by mouth every 6 (six) hours as needed. For pain.   esomeprazole 40 MG capsule Commonly known as:  NEXIUM Take 40 mg by mouth daily at 12 noon.       Sharen CounterLisa Leftwich-Kirby Certified Nurse-Midwife 05/26/2016 1:52 PM

## 2016-05-26 NOTE — Discharge Instructions (Signed)
Try 3 bananas per day for 3 days to replace your low potassium.   Hypokalemia Hypokalemia means that the amount of potassium in the blood is lower than normal.Potassium is a chemical that helps regulate the amount of fluid in the body (electrolyte). It also stimulates muscle tightening (contraction) and helps nerves work properly.Normally, most of the bodys potassium is inside of cells, and only a very small amount is in the blood. Because the amount in the blood is so small, minor changes to potassium levels in the blood can be life-threatening. What are the causes? This condition may be caused by:  Antibiotic medicine.  Diarrhea or vomiting. Taking too much of a medicine that helps you have a bowel movement (laxative) can cause diarrhea and lead to hypokalemia.  Chronic kidney disease (CKD).  Medicines that help the body get rid of excess fluid (diuretics).  Eating disorders, such as bulimia.  Low magnesium levels in the body.  Sweating a lot. What are the signs or symptoms? Symptoms of this condition include:  Weakness.  Constipation.  Fatigue.  Muscle cramps.  Mental confusion.  Skipped heartbeats or irregular heartbeat (palpitations).  Tingling or numbness. How is this diagnosed? This condition is diagnosed with a blood test. How is this treated? Hypokalemia can be treated by taking potassium supplements by mouth or adjusting the medicines that you take. Treatment may also include eating more foods that contain a lot of potassium. If your potassium level is very low, you may need to get potassium through an IV tube in one of your veins and be monitored in the hospital. Follow these instructions at home:  Take over-the-counter and prescription medicines only as told by your health care provider. This includes vitamins and supplements.  Eat a healthy diet. A healthy diet includes fresh fruits and vegetables, whole grains, healthy fats, and lean proteins.  If  instructed, eat more foods that contain a lot of potassium, such as:  Nuts, such as peanuts and pistachios.  Seeds, such as sunflower seeds and pumpkin seeds.  Peas, lentils, and lima beans.  Whole grain and bran cereals and breads.  Fresh fruits and vegetables, such as apricots, avocado, bananas, cantaloupe, kiwi, oranges, tomatoes, asparagus, and potatoes.  Orange juice.  Tomato juice.  Red meats.  Yogurt.  Keep all follow-up visits as told by your health care provider. This is important. Contact a health care provider if:  You have weakness that gets worse.  You feel your heart pounding or racing.  You vomit.  You have diarrhea.  You have diabetes (diabetes mellitus) and you have trouble keeping your blood sugar (glucose) in your target range. Get help right away if:  You have chest pain.  You have shortness of breath.  You have vomiting or diarrhea that lasts for more than 2 days.  You faint. This information is not intended to replace advice given to you by your health care provider. Make sure you discuss any questions you have with your health care provider. Document Released: 06/01/2005 Document Revised: 01/18/2016 Document Reviewed: 01/18/2016 Elsevier Interactive Patient Education  2017 [employer] Inc[employer]   Dehydration, Adult Dehydration is a condition in which there is not enough fluid or water in the body. This happens when you lose more fluids than you take in. Important organs, such as the kidneys, brain, and heart, cannot function without a proper amount of fluids. Any loss of fluids from the body can lead to dehydration. Dehydration can range from mild to severe. This condition should  be treated right away to prevent it from becoming severe. What are the causes? This condition may be caused by:  Vomiting.  Diarrhea.  Excessive sweating, such as from heat exposure or exercise.  Not drinking enough fluid, especially:  When ill.  While doing  activity that requires a lot of energy.  Excessive urination.  Fever.  Infection.  Certain medicines, such as medicines that cause the body to lose excess fluid (diuretics).  Inability to access safe drinking water.  Reduced physical ability to get adequate water and food. What increases the risk? This condition is more likely to develop in people:  Who have a poorly controlled long-term (chronic) illness, such as diabetes, heart disease, or kidney disease.  Who are age 39 or older.  Who are disabled.  Who live in a place with high altitude.  Who play endurance sports. What are the signs or symptoms? Symptoms of mild dehydration may include:  Thirst.  Dry lips.  Slightly dry mouth.  Dry, warm skin.  Dizziness. Symptoms of moderate dehydration may include:  Very dry mouth.  Muscle cramps.  Dark urine. Urine may be the color of tea.  Decreased urine production.  Decreased tear production.  Heartbeat that is irregular or faster than normal (palpitations).  Headache.  Light-headedness, especially when you stand up from a sitting position.  Fainting (syncope). Symptoms of severe dehydration may include:  Changes in skin, such as:  Cold and clammy skin.  Blotchy (mottled) or pale skin.  Skin that does not quickly return to normal after being lightly pinched and released (poor skin turgor).  Changes in body fluids, such as:  Extreme thirst.  No tear production.  Inability to sweat when body temperature is high, such as in hot weather.  Very little urine production.  Changes in vital signs, such as:  Weak pulse.  Pulse that is more than 100 beats a minute when sitting still.  Rapid breathing.  Low blood pressure.  Other changes, such as:  Sunken eyes.  Cold hands and feet.  Confusion.  Lack of energy (lethargy).  Difficulty waking up from sleep.  Short-term weight loss.  Unconsciousness. How is this diagnosed? This  condition is diagnosed based on your symptoms and a physical exam. Blood and urine tests may be done to help confirm the diagnosis. How is this treated? Treatment for this condition depends on the severity. Mild or moderate dehydration can often be treated at home. Treatment should be started right away. Do not wait until dehydration becomes severe. Severe dehydration is an emergency and it needs to be treated in a hospital. Treatment for mild dehydration may include:  Drinking more fluids.  Replacing salts and minerals in your blood (electrolytes) that you may have lost. Treatment for moderate dehydration may include:  Drinking an oral rehydration solution (ORS). This is a drink that helps you replace fluids and electrolytes (rehydrate). It can be found at pharmacies and retail stores. Treatment for severe dehydration may include:  Receiving fluids through an IV tube.  Receiving an electrolyte solution through a feeding tube that is passed through your nose and into your stomach (nasogastric tube, or NG tube).  Correcting any abnormalities in electrolytes.  Treating the underlying cause of dehydration. Follow these instructions at home:  If directed by your health care provider, drink an ORS:  Make an ORS by following instructions on the package.  Start by drinking small amounts, about  cup (120 mL) every 5-10 minutes.  Slowly increase how much you  drink until you have taken the amount recommended by your health care provider.  Drink enough clear fluid to keep your urine clear or pale yellow. If you were told to drink an ORS, finish the ORS first, then start slowly drinking other clear fluids. Drink fluids such as:  Water. Do not drink only water. Doing that can lead to having too little salt (sodium) in the body (hyponatremia).  Ice chips.  Fruit juice that you have added water to (diluted fruit juice).  Low-calorie sports drinks.  Avoid:  Alcohol.  Drinks that contain  a lot of sugar. These include high-calorie sports drinks, fruit juice that is not diluted, and soda.  Caffeine.  Foods that are greasy or contain a lot of fat or sugar.  Take over-the-counter and prescription medicines only as told by your health care provider.  Do not take sodium tablets. This can lead to having too much sodium in the body (hypernatremia).  Eat foods that contain a healthy balance of electrolytes, such as bananas, oranges, potatoes, tomatoes, and spinach.  Keep all follow-up visits as told by your health care provider. This is important. Contact a health care provider if:  You have abdominal pain that:  Gets worse.  Stays in one area (localizes).  You have a rash.  You have a stiff neck.  You are more irritable than usual.  You are sleepier or more difficult to wake up than usual.  You feel weak or dizzy.  You feel very thirsty.  You have urinated only a small amount of very dark urine over 6-8 hours. Get help right away if:  You have symptoms of severe dehydration.  You cannot drink fluids without vomiting.  Your symptoms get worse with treatment.  You have a fever.  You have a severe headache.  You have vomiting or diarrhea that:  Gets worse.  Does not go away.  You have blood or green matter (bile) in your vomit.  You have blood in your stool. This may cause stool to look black and tarry.  You have not urinated in 6-8 hours.  You faint.  Your heart rate while sitting still is over 100 beats a minute.  You have trouble breathing. This information is not intended to replace advice given to you by your health care provider. Make sure you discuss any questions you have with your health care provider. Document Released: 06/01/2005 Document Revised: 12/27/2015 Document Reviewed: 07/26/2015 Elsevier Interactive Patient Education  2017 [employer]

## 2016-05-26 NOTE — Progress Notes (Signed)
Lisa Leftwich-Kirby CNM in earlier to discuss test results and d/c plan. Written and verbal d/c instructions given and understanding voiced. 

## 2016-05-26 NOTE — MAU Note (Signed)
Late Sunday, started vomiting.continued the next several hours. Has not thrown up since, no diarrhea or fever.  Slept well last night, ate supper. This morning after she ate she got dizzy, jittery, has a headache eyes are blurring.

## 2016-05-26 NOTE — Progress Notes (Signed)
Sharen CounterLisa Leftwich-Kirby CNM reviewed strip and d/ced EFM

## 2016-08-24 NOTE — H&P (Signed)
Ruth LynnLekisha C Waller is a 36 y.o. female presenting for repeat c-section.  IVF pregnancy with partners eggs.  Twin pregnancy w/ demise of twin B.   OB History    Gravida Para Term Preterm AB Living   2 1 1     1    SAB TAB Ectopic Multiple Live Births           1     Past Medical History:  Diagnosis Date  . Anemia   . Irregular heartbeat    Past Surgical History:  Procedure Laterality Date  . BREAST BIOPSY     Right breast  . BREAST BIOPSY  2003  . CESAREAN SECTION N/A 07/09/2013   Procedure: CESAREAN SECTION;  Surgeon: Turner Danielsavid C Lowe, MD;  Location: [facility];  Service: Obstetrics;  Laterality: N/A;  . TENDON RECONSTRUCTION     Right thumb   Family History: family history is not on file. Social History:  reports that she has quit smoking. She has never used smokeless tobacco. She reports that she drinks alcohol. She reports that she does not use drugs.     Maternal Diabetes: No Genetic Screening: Normal Maternal Ultrasounds/Referrals: Normal Fetal Ultrasounds or other Referrals:  None Maternal Substance Abuse:  No Significant Maternal Medications:  None Significant Maternal Lab Results:  None Other Comments:  None  ROS History   Last menstrual period 11/11/2015, not currently breastfeeding. Exam Physical Exam  Prenatal labs: ABO, Rh:   Antibody:   Rubella:   RPR:    HBsAg:    HIV:    GBS:   negative  Assessment/Plan: Admit Rpt c-section   [name] 08/24/2016, 1:40 PM

## 2016-08-26 LAB — OB RESULTS CONSOLE GBS: GBS: NEGATIVE

## 2016-08-31 NOTE — H&P (Signed)
[name]  DICTATION # [id number] CSN# [id number]   Ruth PicaHOLLAND,Ruth Vint M, MD 08/31/2016 3:02 PM

## 2016-09-01 ENCOUNTER — Encounter (HOSPITAL_COMMUNITY): Payer: Self-pay | Admitting: *Deleted

## 2016-09-01 NOTE — Consult Note (Signed)
NAME:  [name], [name]            ACCOUNT NO.:  MEDICAL RECORD NO.:  LOCATION:                                 FACILITY:  PHYSICIAN:  Naimah Yingst M. Marcelle OverlieHolland, M.D.    DATE OF BIRTH:  DATE OF CONSULTATION: DATE OF DISCHARGE:                                CONSULTATION   CHIEF COMPLAINT:  Repeat cesarean section at term.  HISTORY OF PRESENT ILLNESS:  This is a 36 year old, G3, P1-0-1-1, at term, previous cesarean section in 2015.  She is a patient of Dr. Renaldo FiddlerAdkins, who is scheduled for repeat cesarean section.  Initially, she did have IVF and had a twin pregnancy with vanishing twin early on.  The remainder of her screening has been normal with a 1-hour GTT of 111. GBS negative.  The procedure of repeat cesarean section including specific risks related to bleeding, infection, transfusion, adjacent organ injury, wound infection, phlebitis, all discussed with her which she understands and accepts.  PAST MEDICAL HISTORY:  She is B positive.  ALLERGIES:  No current drug allergies listed.  PAST SURGICAL HISTORY:  Cesarean section in 2015.  SOCIAL HISTORY:  Please see her [employer] form for details.  FAMILY HISTORY:  Please see her [employer] form for details.  PHYSICAL EXAMINATION:  VITAL SIGNS:  Temperature 98.2, blood pressure 120/78. HEENT:  Unremarkable. NECK:  Supple without masses. LUNGS:  Clear. CARDIOVASCULAR:  Regular rate and rhythm without murmurs, rubs, or gallops. BREASTS:  Not examined. PELVIC:  Term fundal height.  Fetal heart rate 142.  Cervix is closed. EXTREMITIES:  Unremarkable. NEUROLOGIC:  Unremarkable.  IMPRESSION:  Term pregnancy, IVF pregnancy with early vanishing twin, previous cesarean section for repeat cesarean section.  Procedure and risks discussed as above.     [name] M. Marcelle OverlieHolland, M.D.     RMH/MEDQ  D:  08/31/2016  T:  08/31/2016  Job:  [id number]

## 2016-09-03 ENCOUNTER — Encounter (HOSPITAL_COMMUNITY)
Admission: RE | Admit: 2016-09-03 | Discharge: 2016-09-03 | Disposition: A | Discharge status: Home / Self Care | Payer: 59 | Source: Ambulatory Visit | Attending: Obstetrics and Gynecology | Admitting: Obstetrics and Gynecology

## 2016-09-03 LAB — TYPE AND SCREEN
ABO/RH(D): B POS
ANTIBODY SCREEN: NEGATIVE

## 2016-09-03 LAB — CBC
HCT: 38.7 % (ref 36.0–46.0)
Hemoglobin: 12.9 g/dL (ref 12.0–15.0)
MCH: 32.5 pg (ref 26.0–34.0)
MCHC: 33.3 g/dL (ref 30.0–36.0)
MCV: 97.5 fL (ref 78.0–100.0)
Platelets: 141 10*3/uL — ABNORMAL LOW (ref 150–400)
RBC: 3.97 MIL/uL (ref 3.87–5.11)
RDW: 15.2 % (ref 11.5–15.5)
WBC: 7.5 10*3/uL (ref 4.0–10.5)

## 2016-09-03 NOTE — Patient Instructions (Signed)
20 Ruth LynnLekisha C Waller  09/03/2016   Your procedure is scheduled on:  09/04/2016  [facility] at 0530 AM.  Pick up the phone at the desk and dial [phone].   Call this number if you have problems the morning of surgery: [phone]   Remember:   Do not eat food:After Midnight.  Do not drink clear liquids: After Midnight.  Take these medicines the morning of surgery with A SIP OF WATER: may take nexium if you take it in the morning usually   Do not wear jewelry, make-up or nail polish.  Do not wear lotions, powders, or perfumes. Do not wear deodorant.  Do not shave 48 hours prior to surgery.  Do not bring valuables to the hospital.  [facility] is not   responsible for any belongings or valuables brought to the hospital.  Contacts, dentures or bridgework may not be worn into surgery.  Leave suitcase in the car. After surgery it may be brought to your room.  For patients admitted to the hospital, checkout time is 11:00 AM the day of              discharge.   Patients discharged the day of surgery will not be allowed to drive             home.  Name and phone number of your driver: na  Special Instructions:   N/A   Please read over the following fact sheets that you were given:   Surgical Site Infection Prevention

## 2016-09-04 ENCOUNTER — Encounter (HOSPITAL_COMMUNITY)
Admission: AD | Disposition: A | Discharge status: Home / Self Care | Payer: Self-pay | Source: Ambulatory Visit | Attending: Obstetrics and Gynecology

## 2016-09-04 ENCOUNTER — Inpatient Hospital Stay (HOSPITAL_COMMUNITY): Payer: 59 | Admitting: Anesthesiology

## 2016-09-04 ENCOUNTER — Encounter (HOSPITAL_COMMUNITY): Payer: Self-pay | Admitting: Certified Registered Nurse Anesthetist

## 2016-09-04 ENCOUNTER — Inpatient Hospital Stay (HOSPITAL_COMMUNITY)
Admission: AD | Admit: 2016-09-04 | Discharge: 2016-09-06 | DRG: 766 | Disposition: A | Discharge status: Home / Self Care | Payer: 59 | Source: Ambulatory Visit | Attending: Obstetrics and Gynecology | Admitting: Obstetrics and Gynecology

## 2016-09-04 DIAGNOSIS — Z87891 Personal history of nicotine dependence: Secondary | ICD-10-CM

## 2016-09-04 DIAGNOSIS — Z3A39 39 weeks gestation of pregnancy: Secondary | ICD-10-CM

## 2016-09-04 DIAGNOSIS — O34211 Maternal care for low transverse scar from previous cesarean delivery: Secondary | ICD-10-CM | POA: Diagnosis present

## 2016-09-04 DIAGNOSIS — Z3490 Encounter for supervision of normal pregnancy, unspecified, unspecified trimester: Secondary | ICD-10-CM

## 2016-09-04 LAB — RPR: RPR Ser Ql: NONREACTIVE

## 2016-09-04 SURGERY — Surgical Case
Anesthesia: Spinal

## 2016-09-04 MED ORDER — OXYTOCIN 10 UNIT/ML IJ SOLN
INTRAMUSCULAR | Status: AC
  Filled 2016-09-04: qty 4

## 2016-09-04 MED ORDER — ONDANSETRON HCL 4 MG/2ML IJ SOLN
INTRAMUSCULAR | Status: AC
  Filled 2016-09-04: qty 2

## 2016-09-04 MED ORDER — DIPHENHYDRAMINE HCL 25 MG PO CAPS
25.0000 mg | ORAL_CAPSULE | ORAL | Status: DC | PRN
  Administered 2016-09-05: 25 mg via ORAL
  Filled 2016-09-04 (×2): qty 1

## 2016-09-04 MED ORDER — METOCLOPRAMIDE HCL 5 MG/ML IJ SOLN: 10.0000 mg | Freq: Once | INTRAMUSCULAR | Status: DC | PRN

## 2016-09-04 MED ORDER — DIPHENHYDRAMINE HCL 25 MG PO CAPS: 25.0000 mg | ORAL_CAPSULE | Freq: Four times a day (QID) | ORAL | Status: DC | PRN

## 2016-09-04 MED ORDER — NALBUPHINE HCL 10 MG/ML IJ SOLN
5.0000 mg | Freq: Once | INTRAMUSCULAR | Status: AC | PRN
  Administered 2016-09-04: 5 mg via INTRAVENOUS

## 2016-09-04 MED ORDER — LACTATED RINGERS IV BOLUS (SEPSIS)
250.0000 mL | Freq: Once | INTRAVENOUS | Status: AC
  Administered 2016-09-04: 250 mL via INTRAVENOUS

## 2016-09-04 MED ORDER — PRENATAL MULTIVITAMIN CH
1.0000 | ORAL_TABLET | Freq: Every day | ORAL | Status: DC
  Administered 2016-09-05: 1 via ORAL
  Filled 2016-09-04: qty 1

## 2016-09-04 MED ORDER — NALOXONE HCL 0.4 MG/ML IJ SOLN: 0.4000 mg | INTRAMUSCULAR | Status: DC | PRN

## 2016-09-04 MED ORDER — WITCH HAZEL-GLYCERIN EX PADS: 1.0000 "application " | MEDICATED_PAD | CUTANEOUS | Status: DC | PRN

## 2016-09-04 MED ORDER — DEXAMETHASONE SODIUM PHOSPHATE 10 MG/ML IJ SOLN
INTRAMUSCULAR | Status: DC | PRN
  Administered 2016-09-04: 4 mg via INTRAVENOUS

## 2016-09-04 MED ORDER — MEPERIDINE HCL 25 MG/ML IJ SOLN: 6.2500 mg | INTRAMUSCULAR | Status: DC | PRN

## 2016-09-04 MED ORDER — ACETAMINOPHEN 500 MG PO TABS
1000.0000 mg | ORAL_TABLET | Freq: Four times a day (QID) | ORAL | Status: DC
  Administered 2016-09-04 – 2016-09-06 (×8): 1000 mg via ORAL
  Filled 2016-09-04 (×8): qty 2

## 2016-09-04 MED ORDER — OXYCODONE-ACETAMINOPHEN 5-325 MG PO TABS: 2.0000 | ORAL_TABLET | ORAL | Status: DC | PRN

## 2016-09-04 MED ORDER — BISACODYL 10 MG RE SUPP: 10.0000 mg | Freq: Every day | RECTAL | Status: DC | PRN

## 2016-09-04 MED ORDER — PHENYLEPHRINE 8 MG IN D5W 100 ML (0.08MG/ML) PREMIX OPTIME
INJECTION | INTRAVENOUS | Status: AC
Start: 2016-09-04 — End: ?
  Filled 2016-09-04: qty 100

## 2016-09-04 MED ORDER — DIBUCAINE 1 % RE OINT: 1.0000 "application " | TOPICAL_OINTMENT | RECTAL | Status: DC | PRN

## 2016-09-04 MED ORDER — SODIUM CHLORIDE 0.9 % IJ SOLN
INTRAMUSCULAR | Status: AC
  Filled 2016-09-04: qty 20

## 2016-09-04 MED ORDER — SIMETHICONE 80 MG PO CHEW
80.0000 mg | CHEWABLE_TABLET | Freq: Three times a day (TID) | ORAL | Status: DC
  Administered 2016-09-04 – 2016-09-06 (×4): 80 mg via ORAL
  Filled 2016-09-04 (×4): qty 1

## 2016-09-04 MED ORDER — MORPHINE SULFATE (PF) 0.5 MG/ML IJ SOLN
INTRAMUSCULAR | Status: AC
  Filled 2016-09-04: qty 10

## 2016-09-04 MED ORDER — ONDANSETRON HCL 4 MG/2ML IJ SOLN
INTRAMUSCULAR | Status: DC | PRN
  Administered 2016-09-04: 4 mg via INTRAVENOUS

## 2016-09-04 MED ORDER — OXYCODONE HCL 5 MG/5ML PO SOLN: 5.0000 mg | Freq: Once | ORAL | Status: DC | PRN

## 2016-09-04 MED ORDER — OXYTOCIN 40 UNITS IN LACTATED RINGERS INFUSION - SIMPLE MED: 2.5000 [IU]/h | INTRAVENOUS | Status: AC

## 2016-09-04 MED ORDER — DEXTROSE IN LACTATED RINGERS 5 % IV SOLN: INTRAVENOUS | Status: DC

## 2016-09-04 MED ORDER — MORPHINE SULFATE (PF) 0.5 MG/ML IJ SOLN
INTRAMUSCULAR | Status: DC | PRN
  Administered 2016-09-04: .2 mg via EPIDURAL

## 2016-09-04 MED ORDER — NALBUPHINE HCL 10 MG/ML IJ SOLN
5.0000 mg | INTRAMUSCULAR | Status: DC | PRN
  Administered 2016-09-05: 5 mg via INTRAVENOUS
  Filled 2016-09-04 (×2): qty 1

## 2016-09-04 MED ORDER — SIMETHICONE 80 MG PO CHEW
80.0000 mg | CHEWABLE_TABLET | ORAL | Status: DC
  Administered 2016-09-05: 80 mg via ORAL
  Filled 2016-09-04: qty 1

## 2016-09-04 MED ORDER — NALBUPHINE HCL 10 MG/ML IJ SOLN: 5.0000 mg | Freq: Once | INTRAMUSCULAR | Status: AC | PRN

## 2016-09-04 MED ORDER — GLYCOPYRROLATE 0.2 MG/ML IJ SOLN
INTRAMUSCULAR | Status: DC | PRN
  Administered 2016-09-04: 0.2 mg via INTRAVENOUS

## 2016-09-04 MED ORDER — ACETAMINOPHEN 325 MG PO TABS: 650.0000 mg | ORAL_TABLET | ORAL | Status: DC | PRN

## 2016-09-04 MED ORDER — NALBUPHINE HCL 10 MG/ML IJ SOLN: 5.0000 mg | INTRAMUSCULAR | Status: DC | PRN

## 2016-09-04 MED ORDER — COCONUT OIL OIL: 1.0000 "application " | TOPICAL_OIL | Status: DC | PRN

## 2016-09-04 MED ORDER — OXYTOCIN 10 UNIT/ML IJ SOLN
INTRAVENOUS | Status: DC | PRN
  Administered 2016-09-04: 40 [IU] via INTRAVENOUS

## 2016-09-04 MED ORDER — MEPERIDINE HCL 25 MG/ML IJ SOLN
INTRAMUSCULAR | Status: AC
  Filled 2016-09-04: qty 1

## 2016-09-04 MED ORDER — SODIUM CHLORIDE 0.9% FLUSH: 3.0000 mL | INTRAVENOUS | Status: DC | PRN

## 2016-09-04 MED ORDER — GLYCOPYRROLATE 0.2 MG/ML IJ SOLN
INTRAMUSCULAR | Status: AC
  Filled 2016-09-04: qty 1

## 2016-09-04 MED ORDER — NALOXONE HCL 2 MG/2ML IJ SOSY
1.0000 ug/kg/h | PREFILLED_SYRINGE | INTRAMUSCULAR | Status: DC | PRN
  Filled 2016-09-04: qty 2

## 2016-09-04 MED ORDER — TETANUS-DIPHTH-ACELL PERTUSSIS 5-2.5-18.5 LF-MCG/0.5 IM SUSP: 0.5000 mL | Freq: Once | INTRAMUSCULAR | Status: DC

## 2016-09-04 MED ORDER — OXYCODONE HCL 5 MG PO TABS: 5.0000 mg | ORAL_TABLET | Freq: Once | ORAL | Status: DC | PRN

## 2016-09-04 MED ORDER — CEFAZOLIN SODIUM-DEXTROSE 2-4 GM/100ML-% IV SOLN
2.0000 g | INTRAVENOUS | Status: AC
  Administered 2016-09-04: 2 g via INTRAVENOUS

## 2016-09-04 MED ORDER — LACTATED RINGERS IV SOLN
INTRAVENOUS | Status: DC | PRN
  Administered 2016-09-04 (×3): via INTRAVENOUS

## 2016-09-04 MED ORDER — FENTANYL CITRATE (PF) 100 MCG/2ML IJ SOLN
INTRAMUSCULAR | Status: AC
  Filled 2016-09-04: qty 2

## 2016-09-04 MED ORDER — SODIUM CHLORIDE 0.9 % IV SOLN: 250.0000 mL | INTRAVENOUS | Status: DC

## 2016-09-04 MED ORDER — FLEET ENEMA 7-19 GM/118ML RE ENEM: 1.0000 | ENEMA | Freq: Every day | RECTAL | Status: DC | PRN

## 2016-09-04 MED ORDER — KETOROLAC TROMETHAMINE 30 MG/ML IJ SOLN
30.0000 mg | Freq: Once | INTRAMUSCULAR | Status: AC | PRN
  Administered 2016-09-04: 30 mg via INTRAVENOUS

## 2016-09-04 MED ORDER — SCOPOLAMINE 1 MG/3DAYS TD PT72: 1.0000 | MEDICATED_PATCH | Freq: Once | TRANSDERMAL | Status: DC

## 2016-09-04 MED ORDER — PHENYLEPHRINE 8 MG IN D5W 100 ML (0.08MG/ML) PREMIX OPTIME
INJECTION | INTRAVENOUS | Status: DC | PRN
  Administered 2016-09-04: 60 ug/min via INTRAVENOUS

## 2016-09-04 MED ORDER — KETOROLAC TROMETHAMINE 30 MG/ML IJ SOLN
INTRAMUSCULAR | Status: AC
  Administered 2016-09-04: 30 mg via INTRAVENOUS
  Filled 2016-09-04: qty 1

## 2016-09-04 MED ORDER — MENTHOL 3 MG MT LOZG: 1.0000 | LOZENGE | OROMUCOSAL | Status: DC | PRN

## 2016-09-04 MED ORDER — SIMETHICONE 80 MG PO CHEW
80.0000 mg | CHEWABLE_TABLET | ORAL | Status: DC | PRN
  Administered 2016-09-05: 80 mg via ORAL
  Filled 2016-09-04: qty 1

## 2016-09-04 MED ORDER — BUPIVACAINE IN DEXTROSE 0.75-8.25 % IT SOLN
INTRATHECAL | Status: DC | PRN
  Administered 2016-09-04: 1.6 mL via INTRATHECAL

## 2016-09-04 MED ORDER — SCOPOLAMINE 1 MG/3DAYS TD PT72
1.0000 | MEDICATED_PATCH | Freq: Once | TRANSDERMAL | Status: DC
  Administered 2016-09-04: 1.5 mg via TRANSDERMAL
  Filled 2016-09-04: qty 1

## 2016-09-04 MED ORDER — DEXAMETHASONE SODIUM PHOSPHATE 4 MG/ML IJ SOLN
INTRAMUSCULAR | Status: AC
  Filled 2016-09-04: qty 1

## 2016-09-04 MED ORDER — METOCLOPRAMIDE HCL 5 MG/ML IJ SOLN
INTRAMUSCULAR | Status: AC
  Filled 2016-09-04: qty 2

## 2016-09-04 MED ORDER — FENTANYL CITRATE (PF) 100 MCG/2ML IJ SOLN
INTRAMUSCULAR | Status: DC | PRN
  Administered 2016-09-04: 10 ug via INTRATHECAL

## 2016-09-04 MED ORDER — DIPHENHYDRAMINE HCL 50 MG/ML IJ SOLN: 12.5000 mg | INTRAMUSCULAR | Status: DC | PRN

## 2016-09-04 MED ORDER — ONDANSETRON HCL 4 MG/2ML IJ SOLN: 4.0000 mg | Freq: Three times a day (TID) | INTRAMUSCULAR | Status: DC | PRN

## 2016-09-04 MED ORDER — METOCLOPRAMIDE HCL 5 MG/ML IJ SOLN
INTRAMUSCULAR | Status: DC | PRN
  Administered 2016-09-04: 10 mg via INTRAVENOUS

## 2016-09-04 MED ORDER — IBUPROFEN 800 MG PO TABS: 800.0000 mg | ORAL_TABLET | Freq: Three times a day (TID) | ORAL | Status: DC | PRN

## 2016-09-04 MED ORDER — SOD CITRATE-CITRIC ACID 500-334 MG/5ML PO SOLN
30.0000 mL | Freq: Once | ORAL | Status: AC
  Administered 2016-09-04: 30 mL via ORAL
  Filled 2016-09-04: qty 15

## 2016-09-04 MED ORDER — ZOLPIDEM TARTRATE 5 MG PO TABS: 5.0000 mg | ORAL_TABLET | Freq: Every evening | ORAL | Status: DC | PRN

## 2016-09-04 MED ORDER — CEFOTETAN DISODIUM-DEXTROSE 2-2.08 GM-% IV SOLR: 2.0000 g | INTRAVENOUS | Status: DC

## 2016-09-04 MED ORDER — OXYCODONE-ACETAMINOPHEN 5-325 MG PO TABS: 1.0000 | ORAL_TABLET | ORAL | Status: DC | PRN

## 2016-09-04 MED ORDER — MEPERIDINE HCL 25 MG/ML IJ SOLN
INTRAMUSCULAR | Status: DC | PRN
  Administered 2016-09-04 (×2): 12.5 mg via INTRAVENOUS

## 2016-09-04 MED ORDER — LACTATED RINGERS IV SOLN
INTRAVENOUS | Status: DC | PRN
  Administered 2016-09-04: 08:00:00 via INTRAVENOUS

## 2016-09-04 MED ORDER — LACTATED RINGERS IV SOLN: INTRAVENOUS | Status: DC

## 2016-09-04 MED ORDER — KETOROLAC TROMETHAMINE 30 MG/ML IJ SOLN: 30.0000 mg | Freq: Four times a day (QID) | INTRAMUSCULAR | Status: DC | PRN

## 2016-09-04 MED ORDER — IBUPROFEN 600 MG PO TABS
600.0000 mg | ORAL_TABLET | Freq: Four times a day (QID) | ORAL | Status: DC
  Administered 2016-09-04 – 2016-09-06 (×7): 600 mg via ORAL
  Filled 2016-09-04 (×7): qty 1

## 2016-09-04 MED ORDER — SODIUM CHLORIDE 0.9% FLUSH: 3.0000 mL | Freq: Two times a day (BID) | INTRAVENOUS | Status: DC

## 2016-09-04 MED ORDER — MEASLES, MUMPS & RUBELLA VAC ~~LOC~~ INJ: 0.5000 mL | INJECTION | Freq: Once | SUBCUTANEOUS | Status: DC

## 2016-09-04 MED ORDER — HYDROMORPHONE HCL 1 MG/ML IJ SOLN: 0.2500 mg | INTRAMUSCULAR | Status: DC | PRN

## 2016-09-04 MED ORDER — SENNOSIDES-DOCUSATE SODIUM 8.6-50 MG PO TABS
2.0000 | ORAL_TABLET | ORAL | Status: DC
  Administered 2016-09-05 (×2): 2 via ORAL
  Filled 2016-09-04 (×2): qty 2

## 2016-09-04 SURGICAL SUPPLY — 29 items
CHLORAPREP W/TINT 26ML (MISCELLANEOUS) ×3 IMPLANT
CLAMP CORD UMBIL (MISCELLANEOUS) IMPLANT
CLOSURE WOUND 1/2 X4 (GAUZE/BANDAGES/DRESSINGS) ×1
CLOTH BEACON ORANGE TIMEOUT ST (SAFETY) ×3 IMPLANT
DRSG OPSITE POSTOP 4X10 (GAUZE/BANDAGES/DRESSINGS) ×3 IMPLANT
ELECT REM PT RETURN 9FT ADLT (ELECTROSURGICAL) ×3
ELECTRODE REM PT RTRN 9FT ADLT (ELECTROSURGICAL) ×1 IMPLANT
EXTRACTOR VACUUM M CUP 4 TUBE (SUCTIONS) IMPLANT
EXTRACTOR VACUUM M CUP 4' TUBE (SUCTIONS)
GLOVE BIO SURGEON STRL SZ7 (GLOVE) ×3 IMPLANT
GLOVE BIOGEL PI IND STRL 7.0 (GLOVE) ×2 IMPLANT
GLOVE BIOGEL PI INDICATOR 7.0 (GLOVE) ×4
GOWN STRL REUS W/TWL LRG LVL3 (GOWN DISPOSABLE) ×6 IMPLANT
KIT ABG SYR 3ML LUER SLIP (SYRINGE) IMPLANT
NEEDLE HYPO 25X5/8 SAFETYGLIDE (NEEDLE) ×3 IMPLANT
NS IRRIG 1000ML POUR BTL (IV SOLUTION) ×3 IMPLANT
PACK C SECTION WH (CUSTOM PROCEDURE TRAY) ×3 IMPLANT
PAD ABD 7.5X8 STRL (GAUZE/BANDAGES/DRESSINGS) ×6 IMPLANT
PAD ABD 8X10 STRL (GAUZE/BANDAGES/DRESSINGS) ×3 IMPLANT
PAD OB MATERNITY 4.3X12.25 (PERSONAL CARE ITEMS) ×3 IMPLANT
PENCIL SMOKE EVAC W/HOLSTER (ELECTROSURGICAL) ×3 IMPLANT
STRIP CLOSURE SKIN 1/2X4 (GAUZE/BANDAGES/DRESSINGS) ×2 IMPLANT
SUT CHROMIC 0 CTX 36 (SUTURE) ×9 IMPLANT
SUT MON AB 4-0 PS1 27 (SUTURE) ×3 IMPLANT
SUT PDS AB 0 CT1 27 (SUTURE) ×6 IMPLANT
SUT VIC AB 3-0 CT1 27 (SUTURE) ×4
SUT VIC AB 3-0 CT1 TAPERPNT 27 (SUTURE) ×2 IMPLANT
TOWEL OR 17X24 6PK STRL BLUE (TOWEL DISPOSABLE) ×3 IMPLANT
TRAY FOLEY CATH SILVER 14FR (SET/KITS/TRAYS/PACK) ×3 IMPLANT

## 2016-09-04 NOTE — Consult Note (Signed)
[facility]  Delivery Note:  C-section       09/04/2016  7:30 AM  I was called to the operating room at the request of the patient's obstetrician (Dr. Marcelle OverlieHolland) for a repeat c-section.  PRENATAL HX:  This is a 36 y/o G3P1011 at 3739 and 0/[redacted] weeks gestation who was admitted today for a repeat c-section.  Pregnancy complicated by IVF pregnancy that was initially twins with vanishing twin early on.    INTRAPARTUM HX:   Repeat c-section with AROM at delivery  DELIVERY:  Infant was vigorous at delivery, requiring no resuscitation other than standard warming, drying and stimulation.  APGARs 8 and 9.  Exam within normal limits.  After 5 minutes, baby left with nurse to assist parents with skin-to-skin care.   _____________________ Electronically Signed By: Maryan CharLindsey Gabriell Casimir, MD Neonatologist

## 2016-09-04 NOTE — Progress Notes (Signed)
Dr. Marcelle OverlieHolland called at 2240 due to patient urinary output being inadequate. Orders given to bolus 200-300 mL/30 minutes and then to continue fluids at normal rate of 15825mL/hr. Orders given that Urinary catheter can be removed per patient request; patient requested to have it removed.

## 2016-09-04 NOTE — Transfer of Care (Signed)
Immediate Anesthesia Transfer of Care Note  Patient: [name]  Procedure(s) Performed: Procedure(s) with comments: CESAREAN SECTION (N/A) - Need RNFA - Heather K confirmed  Patient Location: PACU  Anesthesia Type:Spinal  Level of Consciousness: awake, alert , oriented and patient cooperative  Airway & Oxygen Therapy: Patient Spontanous Breathing  Post-op Assessment: Report given to RN and Post -op Vital signs reviewed and stable  Post vital signs: Reviewed and stable  Last Vitals:  Vitals:   09/04/16 0543  BP: (!) 147/100  Pulse: 69  Resp: 18  Temp: 36.5 C    Last Pain:  Vitals:   09/04/16 0543  TempSrc: Oral  PainSc: 0-No pain         Complications: No apparent anesthesia complications

## 2016-09-04 NOTE — Op Note (Signed)
Preoperative diagnosis: Repeat cesarean section at term  Postoperative diagnosis: Same  Procedure: Repeat low transverse cesarean section  Surgeon: Marcelle OverlieHolland  Anesthesia: Spinal  Procedure and findings:  Patient taken the operating room after an adequate level of spinal anesthetic was obtained with the patient in left tilt position, the abdomen prepped and draped in the usual fashion Foley catheter was positioned in appropriate timeouts were taken. The old incision scar was excised and in the lips this is carried down to the fascia which was incised and extended transversely. Rectus muscle divided in the midline, peritoneum entered sparely without incident and extended in a vertical fashion. The vesicouterine serosa was incised and the bladder was bluntly and sharply dissected below, bladder blade repositioned at that point. Transverse incision made in the lower segment clear fluid noted this was extended with blunt dissection the patient then delivered of a vigorous infant from the vertex presentation. One true knot was noted the infant was suctioned cord clamped and passed the pediatric team for further care. Placenta delivered manually intact, uterus exteriorized, cavity wiped with a laparotomy pack closure transversely of 0 chromic in a locked fashion followed by number cutting layer of 0 chromic. This was hemostatic bladder flap area intact and hemostatic bilateral tubes and ovaries were normal.  Prior to closure sponge, needle, instrument counts reported as correct 2. Peritoneum closed with 3-0 Vicryl running suture the same on the rectus muscles in the midline, 0 PDS transversely to close the fascia. The subcutaneous tissue was undermined to reduce tension, this was heme made hemostatic with the Bovie was fairly thin was not closed separately. 4-0 Monocryl subcuticular skin closure with a pressure dressing. She tolerated this well went to recovery room in good condition.  Dictated with dragon  medical  Ammiel Guiney Milana ObeyM Ikeem Cleckler M.D.

## 2016-09-04 NOTE — Anesthesia Postprocedure Evaluation (Signed)
Anesthesia Post Note  Patient: [name]  Procedure(s) Performed: Procedure(s) (LRB): CESAREAN SECTION (N/A)  Patient location during evaluation: Mother Baby Anesthesia Type: Spinal Level of consciousness: awake Pain management: satisfactory to patient Vital Signs Assessment: post-procedure vital signs reviewed and stable Respiratory status: spontaneous breathing Cardiovascular status: stable Anesthetic complications: no        Last Vitals:  Vitals:   09/04/16 1220 09/04/16 1320  BP: 96/66 102/67  Pulse: 72 70  Resp: 18 18  Temp: 36.6 C 36.5 C    Last Pain:  Vitals:   09/04/16 1320  TempSrc: Axillary  PainSc: 1    Pain Goal:                 KeyCorpBURGER,Rashun Grattan

## 2016-09-04 NOTE — Addendum Note (Signed)
Addendum  created 09/04/16 1329 by Algis GreenhouseLinda A Labaron Digirolamo, CRNA   Sign clinical note

## 2016-09-04 NOTE — Progress Notes (Signed)
The patient was re-examined with no change in status 

## 2016-09-04 NOTE — Anesthesia Preprocedure Evaluation (Signed)
Anesthesia Evaluation  Patient identified by MRN, date of birth, ID band Patient awake    Reviewed: Allergy & Precautions, NPO status , Patient's Chart, lab work & pertinent test results  Airway Mallampati: II  TM Distance: >3 FB Neck ROM: Full    Dental no notable dental hx.    Pulmonary neg pulmonary ROS, former smoker,    Pulmonary exam normal breath sounds clear to auscultation       Cardiovascular negative cardio ROS Normal cardiovascular exam Rhythm:Regular Rate:Normal     Neuro/Psych negative neurological ROS  negative psych ROS   GI/Hepatic negative GI ROS, Neg liver ROS,   Endo/Other  negative endocrine ROS  Renal/GU negative Renal ROS  negative genitourinary   Musculoskeletal negative musculoskeletal ROS (+)   Abdominal   Peds negative pediatric ROS (+)  Hematology negative hematology ROS (+)   Anesthesia Other Findings   Reproductive/Obstetrics negative OB ROS (+) Pregnancy                             Anesthesia Physical Anesthesia Plan  ASA: II  Anesthesia Plan: Spinal   Post-op Pain Management:    Induction:   Airway Management Planned: Natural Airway  Additional Equipment:   Intra-op Plan:   Post-operative Plan: Extubation in OR  Informed Consent: I have reviewed the patients History and Physical, chart, labs and discussed the procedure including the risks, benefits and alternatives for the proposed anesthesia with the patient or authorized representative who has indicated his/her understanding and acceptance.     Plan Discussed with: CRNA  Anesthesia Plan Comments:         Anesthesia Quick Evaluation

## 2016-09-04 NOTE — Anesthesia Postprocedure Evaluation (Signed)
Anesthesia Post Note  Patient: Edythe LynnLekisha C Hampton-Burton  Procedure(s) Performed: Procedure(s) (LRB): CESAREAN SECTION (N/A)  Patient location during evaluation: PACU Anesthesia Type: Spinal Level of consciousness: oriented and awake and alert Pain management: pain level controlled Vital Signs Assessment: post-procedure vital signs reviewed and stable Respiratory status: spontaneous breathing and respiratory function stable Cardiovascular status: blood pressure returned to baseline and stable Postop Assessment: no headache and no backache Anesthetic complications: no        Last Vitals:  Vitals:   09/04/16 0930 09/04/16 1000  BP: (!) 141/101 116/83  Pulse: 69 65  Resp: 17 14  Temp:      Last Pain:  Vitals:   09/04/16 1000  TempSrc:   PainSc: 1    Pain Goal:                 Lowella CurbWarren Ray Ireene Ballowe

## 2016-09-05 LAB — CBC
HEMATOCRIT: 24.4 % — AB (ref 36.0–46.0)
HEMOGLOBIN: 8.3 g/dL — AB (ref 12.0–15.0)
MCH: 33.1 pg (ref 26.0–34.0)
MCHC: 34 g/dL (ref 30.0–36.0)
MCV: 97.2 fL (ref 78.0–100.0)
Platelets: 134 10*3/uL — ABNORMAL LOW (ref 150–400)
RBC: 2.51 MIL/uL — AB (ref 3.87–5.11)
RDW: 15.4 % (ref 11.5–15.5)
WBC: 17.4 10*3/uL — AB (ref 4.0–10.5)

## 2016-09-05 NOTE — Progress Notes (Signed)
Pt voided twice in 1 hour adding up to about at 6 hours post cath removal; states that she still feels like she has some urine in her bladder; bladder scan done and showed <200 mL in bladder

## 2016-09-05 NOTE — Progress Notes (Signed)
Subjective: Postpartum Day 1: Cesarean Delivery Patient reports tolerating PO.    Objective: Vital signs in last 24 hours: Temp:  [97.5 F (36.4 C)-98.2 F (36.8 C)] 98.2 F (36.8 C) (03/24 0530) Pulse Rate:  [61-89] 73 (03/24 0530) Resp:  [14-18] 18 (03/24 0530) BP: (96-149)/(57-106) 121/61 (03/24 0530) SpO2:  [99 %-100 %] 100 % (03/24 0530)  Physical Exam:  General: alert Lochia: appropriate Uterine Fundus: firm Incision: healing well DVT Evaluation: No evidence of DVT seen on physical exam.   Recent Labs  09/03/16 1135 09/05/16 0533  HGB 12.9 8.3*  HCT 38.7 24.4*    Assessment/Plan: Status post Cesarean section. Doing well postoperatively.  Continue current care.  Meriel PicaHOLLAND,Jaquarious Grey M 09/05/2016, 7:29 AM

## 2016-09-05 NOTE — Plan of Care (Signed)
Problem: Urinary Elimination: Goal: Ability to reestablish a normal urinary elimination pattern will improve Outcome: Progressing Patient urinary output low; orders given that it is okay to pull catheter; patient void small amount (about ) of concentrated urine; still awaiting adequate void.

## 2016-09-06 ENCOUNTER — Encounter (HOSPITAL_COMMUNITY): Payer: Self-pay | Admitting: *Deleted

## 2016-09-06 MED ORDER — IBUPROFEN 800 MG PO TABS: 800.0000 mg | ORAL_TABLET | Freq: Three times a day (TID) | ORAL | 0 refills | Status: DC | PRN

## 2016-09-06 MED ORDER — OXYCODONE-ACETAMINOPHEN 5-325 MG PO TABS: 1.0000 | ORAL_TABLET | ORAL | 0 refills | Status: DC | PRN

## 2016-09-06 NOTE — Discharge Summary (Signed)
Obstetric Discharge Summary Reason for Admission: cesarean section Prenatal Procedures: none Intrapartum Procedures: cesarean: low cervical, transverse Postpartum Procedures: none Complications-Operative and Postpartum: none Hemoglobin  Date Value Ref Range Status  09/05/2016 8.3 (L) 12.0 - 15.0 g/dL Final    Comment:    DELTA CHECK NOTED REPEATED TO VERIFY   12/26/2012 12.9 g/dL Final   HCT  Date Value Ref Range Status  09/05/2016 24.4 (L) 36.0 - 46.0 % Final  12/26/2012 37 % Final    Physical Exam:  General: alert Lochia: appropriate Uterine Fundus: firm Incision: healing well DVT Evaluation: No evidence of DVT seen on physical exam.  Discharge Diagnoses: Term Pregnancy-delivered  Discharge Information: Date: 09/06/2016 Activity: pelvic rest Diet: routine Medications: PNV, Ibuprofen and Percocet Condition: stable Instructions: refer to practice specific booklet Discharge to: home Follow-up Information    [facility]. Schedule an appointment as soon as possible for a visit in 8 day(s).   Contact information: [address] [place] [id number] [phone]           Newborn Data: Live born female  Birth Weight: 9 lb 2.9 oz (4165 g) APGAR: 8, 9  Home with mother.  Meriel PicaHOLLAND,Nikko Goldwire M 09/06/2016, 7:16 AM

## 2016-09-07 ENCOUNTER — Encounter (HOSPITAL_COMMUNITY): Admission: RE | Admit: 2016-09-07 | Payer: Self-pay | Source: Ambulatory Visit

## 2016-09-07 HISTORY — DX: Post-traumatic stress disorder, unspecified: F43.10

## 2016-09-21 ENCOUNTER — Encounter (HOSPITAL_COMMUNITY): Payer: Self-pay | Admitting: Obstetrics and Gynecology

## 2017-07-29 ENCOUNTER — Encounter (HOSPITAL_COMMUNITY): Payer: Self-pay | Admitting: Emergency Medicine

## 2017-07-29 ENCOUNTER — Emergency Department (HOSPITAL_COMMUNITY)
Admission: EM | Admit: 2017-07-29 | Discharge: 2017-07-29 | Disposition: A | Discharge status: Home / Self Care | Payer: BLUE CROSS/BLUE SHIELD | Attending: Emergency Medicine | Admitting: Emergency Medicine

## 2017-07-29 DIAGNOSIS — R197 Diarrhea, unspecified: Secondary | ICD-10-CM | POA: Diagnosis present

## 2017-07-29 DIAGNOSIS — Z87891 Personal history of nicotine dependence: Secondary | ICD-10-CM | POA: Diagnosis not present

## 2017-07-29 DIAGNOSIS — R112 Nausea with vomiting, unspecified: Secondary | ICD-10-CM | POA: Diagnosis not present

## 2017-07-29 DIAGNOSIS — R109 Unspecified abdominal pain: Secondary | ICD-10-CM

## 2017-07-29 DIAGNOSIS — R1013 Epigastric pain: Secondary | ICD-10-CM | POA: Insufficient documentation

## 2017-07-29 DIAGNOSIS — Z79899 Other long term (current) drug therapy: Secondary | ICD-10-CM | POA: Insufficient documentation

## 2017-07-29 LAB — CBC
HCT: 39.2 % (ref 36.0–46.0)
Hemoglobin: 13.4 g/dL (ref 12.0–15.0)
MCH: 31.7 pg (ref 26.0–34.0)
MCHC: 34.2 g/dL (ref 30.0–36.0)
MCV: 92.7 fL (ref 78.0–100.0)
PLATELETS: 293 10*3/uL (ref 150–400)
RBC: 4.23 MIL/uL (ref 3.87–5.11)
RDW: 13.9 % (ref 11.5–15.5)
WBC: 16.3 10*3/uL — ABNORMAL HIGH (ref 4.0–10.5)

## 2017-07-29 LAB — URINALYSIS, ROUTINE W REFLEX MICROSCOPIC
Bilirubin Urine: NEGATIVE
Glucose, UA: NEGATIVE mg/dL
Ketones, ur: 5 mg/dL — AB
Leukocytes, UA: NEGATIVE
Nitrite: NEGATIVE
PH: 5 (ref 5.0–8.0)
Protein, ur: NEGATIVE mg/dL
SPECIFIC GRAVITY, URINE: 1.017 (ref 1.005–1.030)

## 2017-07-29 LAB — COMPREHENSIVE METABOLIC PANEL
ALK PHOS: 47 U/L (ref 38–126)
ALT: 17 U/L (ref 14–54)
AST: 26 U/L (ref 15–41)
Albumin: 4.4 g/dL (ref 3.5–5.0)
Anion gap: 9 (ref 5–15)
BUN: 13 mg/dL (ref 6–20)
CALCIUM: 8.9 mg/dL (ref 8.9–10.3)
CHLORIDE: 104 mmol/L (ref 101–111)
CO2: 25 mmol/L (ref 22–32)
CREATININE: 0.81 mg/dL (ref 0.44–1.00)
GFR calc Af Amer: >60 mL/min (ref >60)
GFR calc non Af Amer: >60 mL/min (ref >60)
Glucose, Bld: 107 mg/dL — ABNORMAL HIGH (ref 65–99)
Potassium: 3.7 mmol/L (ref 3.5–5.1)
Sodium: 138 mmol/L (ref 135–145)
Total Bilirubin: 1.8 mg/dL — ABNORMAL HIGH (ref 0.3–1.2)
Total Protein: 7.7 g/dL (ref 6.5–8.1)

## 2017-07-29 LAB — I-STAT BETA HCG BLOOD, ED (MC, WL, AP ONLY): I-stat hCG, quantitative: <5 m[IU]/mL (ref <5)

## 2017-07-29 LAB — LIPASE, BLOOD: LIPASE: 32 U/L (ref 11–51)

## 2017-07-29 MED ORDER — ONDANSETRON 4 MG PO TBDP: 4.0000 mg | ORAL_TABLET | Freq: Three times a day (TID) | ORAL | 0 refills | Status: AC | PRN

## 2017-07-29 MED ORDER — GI COCKTAIL ~~LOC~~
30.0000 mL | Freq: Once | ORAL | Status: AC
  Administered 2017-07-29: 30 mL via ORAL
  Filled 2017-07-29: qty 30

## 2017-07-29 MED ORDER — SODIUM CHLORIDE 0.9 % IV BOLUS (SEPSIS)
1000.0000 mL | Freq: Once | INTRAVENOUS | Status: AC
  Administered 2017-07-29: 1000 mL via INTRAVENOUS

## 2017-07-29 MED ORDER — ONDANSETRON HCL 4 MG/2ML IJ SOLN
4.0000 mg | Freq: Once | INTRAMUSCULAR | Status: AC
  Administered 2017-07-29: 4 mg via INTRAVENOUS
  Filled 2017-07-29: qty 2

## 2017-07-29 NOTE — ED Triage Notes (Signed)
Per [facility] pt from home for diarrhea that started this morning. Patient reports abd pains that were burning then had diarrhea, after having BM felt weak and sweaty.

## 2017-07-29 NOTE — ED Provider Notes (Signed)
[facility]-EMERGENCY DEPT Provider Note  CSN: [id number] Arrival date & time: 07/29/17 4098  Chief Complaint(s) Diarrhea and Abdominal Pain  HPI [name] is a 37 y.o. female   The history is provided by the patient.  Diarrhea   This is a new problem. The current episode started 3 to 5 hours ago. The problem occurs 2 to 4 times per day. The stool consistency is described as watery. There has been no fever. Associated symptoms include abdominal pain, chills and sweats. Pertinent negatives include no vomiting, no headaches, no arthralgias, no myalgias, no URI and no cough. She has tried nothing for the symptoms.  Abdominal Pain   This is a new problem. The current episode started 1 to 2 hours ago. Episode frequency: intermittent. The problem has been gradually improving. The pain is associated with an unknown factor. The pain is located in the periumbilical region. The quality of the pain is cramping and colicky. The pain is moderate. Associated symptoms include diarrhea and nausea. Pertinent negatives include hematochezia, melena, vomiting, dysuria, headaches, arthralgias and myalgias. Relieved by: BM.    Past Medical History Past Medical History:  Diagnosis Date  . Anemia   . Irregular heartbeat   . PTSD (post-traumatic stress disorder)    served in [place]   Patient Active Problem List   Diagnosis Date Noted  . Pregnancy 09/04/2016  . Cesarean delivery delivered 07/10/2013   Home Medication(s) Prior to Admission medications   Medication Sig Start Date End Date Taking? Authorizing Provider  esomeprazole (NEXIUM) 40 MG capsule Take 40 mg by mouth daily.    Yes [provider]  ibuprofen (ADVIL,MOTRIN) 800 MG tablet Take 1 tablet (800 mg total) by mouth every 8 (eight) hours as needed for moderate pain. 09/06/16  Yes Richarda Overlie, MD  ondansetron (ZOFRAN ODT) 4 MG disintegrating tablet Take 1 tablet (4 mg total) by mouth every 8 (eight)  hours as needed for up to 3 days for nausea or vomiting. 07/29/17 08/01/17  Mahlon Gabrielle, Amadeo Garnet, MD  oxyCODONE-acetaminophen (PERCOCET/ROXICET) 5-325 MG tablet Take 1 tablet by mouth every 4 (four) hours as needed (pain scale 4-7). Patient not taking: Reported on 07/29/2017 09/06/16   Richarda Overlie, MD                                                                                                                                    Past Surgical History Past Surgical History:  Procedure Laterality Date  . BREAST BIOPSY     Right breast  . BREAST BIOPSY  2003  . CESAREAN SECTION N/A 07/09/2013   Procedure: CESAREAN SECTION;  Surgeon: Turner Daniels, MD;  Location: [facility];  Service: Obstetrics;  Laterality: N/A;  . CESAREAN SECTION N/A 09/04/2016   Procedure: CESAREAN SECTION;  Surgeon: Richarda Overlie, MD;  Location: [facility] BIRTHING SUITES;  Service: Obstetrics;  Laterality: N/A;  Need RNFA - Heather K confirmed  .  TENDON RECONSTRUCTION     Right thumb   Family History Family History  Problem Relation Age of Onset  . Hypertension Mother   . Deep vein thrombosis Mother   . Osteopenia Mother   . Heart disease Father   . Cancer Father        lung  . Stomach cancer Sister   . Cancer Brother        brain  . Hypertension Maternal Grandmother   . Stomach cancer Maternal Grandmother     Social History Social History   Tobacco Use  . Smoking status: Former Games developer  . Smokeless tobacco: Never Used  Substance Use Topics  . Alcohol use: Yes    Comment: occasional  . Drug use: No   Allergies Codeine; Oxycodone; and Vicodin [hydrocodone-acetaminophen]  Review of Systems Review of Systems  Constitutional: Positive for chills.  Respiratory: Negative for cough.   Gastrointestinal: Positive for abdominal pain, diarrhea and nausea. Negative for hematochezia, melena and vomiting.  Genitourinary: Negative for dysuria.  Musculoskeletal: Negative for arthralgias and myalgias.  Neurological:  Negative for headaches.   All other systems are reviewed and are negative for acute change except as noted in the HPI  Physical Exam Vital Signs  I have reviewed the triage vital signs BP 106/68   Pulse (!) 57   Temp 97.9 F (36.6 C) (Oral)   Resp 19   Ht 5\' 8"  (1.727 m)   Wt 56.2 kg (124 lb)   SpO2 99%   BMI 18.85 kg/m   Physical Exam  Constitutional: She is oriented to person, place, and time. She appears well-developed and well-nourished. No distress.  HENT:  Head: Normocephalic and atraumatic.  Nose: Nose normal.  Eyes: Conjunctivae and EOM are normal. Pupils are equal, round, and reactive to light. Right eye exhibits no discharge. Left eye exhibits no discharge. No scleral icterus.  Neck: Normal range of motion. Neck supple.  Cardiovascular: Normal rate and regular rhythm. Exam reveals no gallop and no friction rub.  No murmur heard. Pulmonary/Chest: Effort normal and breath sounds normal. No stridor. No respiratory distress. She has no rales.  Abdominal: Soft. She exhibits no distension. There is tenderness in the epigastric area. There is no rigidity and no guarding.  Musculoskeletal: She exhibits no edema or tenderness.  Neurological: She is alert and oriented to person, place, and time.  Skin: Skin is warm and dry. No rash noted. She is not diaphoretic. No erythema.  Psychiatric: She has a normal mood and affect.  Vitals reviewed.   ED Results and Treatments Labs (all labs ordered are listed, but only abnormal results are displayed) Labs Reviewed  COMPREHENSIVE METABOLIC PANEL - Abnormal; Notable for the following components:      Result Value   Glucose, Bld 107 (*)    Total Bilirubin 1.8 (*)    All other components within normal limits  CBC - Abnormal; Notable for the following components:   WBC 16.3 (*)    All other components within normal limits  URINALYSIS, ROUTINE W REFLEX MICROSCOPIC - Abnormal; Notable for the following components:   Color, Urine  AMBER (*)    Hgb urine dipstick SMALL (*)    Ketones, ur 5 (*)    Bacteria, UA RARE (*)    Squamous Epithelial / LPF 6-30 (*)    All other components within normal limits  LIPASE, BLOOD  I-STAT BETA HCG BLOOD, ED ([facility], [facility], [facility] ONLY)  EKG  EKG Interpretation  Date/Time:    Ventricular Rate:    PR Interval:    QRS Duration:   QT Interval:    QTC Calculation:   R Axis:     Text Interpretation:        Radiology No results found. Pertinent labs & imaging results that were available during my care of the patient were reviewed by me and considered in my medical decision making (see chart for details).  Medications Ordered in ED Medications  sodium chloride 0.9 % bolus 1,000 mL (1,000 mLs Intravenous New Bag/Given 07/29/17 1045)  ondansetron (ZOFRAN) injection 4 mg (4 mg Intravenous Given 07/29/17 1045)  gi cocktail (Maalox,Lidocaine,Donnatal) (30 mLs Oral Given 07/29/17 1045)                                                                                                                                    Procedures Procedures  (including critical care time)  Medical Decision Making / ED Course I have reviewed the nursing notes for this encounter and the patient's prior records (if available in EHR or on provided paperwork).    Patient here with abdominal cramping and emesis.  Abdomen with mild epigastric discomfort.  Labs with leukocytosis otherwise grossly reassuring without evidence of biliary obstruction or pancreatitis.  Patient provided with antiemetics and GI cocktail.  Able to tolerate oral hydration.  Complete resolution of patient's symptoms with a GI cocktail.  Repeat abdominal exam benign.  Low suspicious for serious intra-abdominal inflammatory/infectious process, requiring imaging at this time.  Most suspicious for food poisoning versus  gastroenteritis.  The patient appears reasonably screened and/or stabilized for discharge and I doubt any other medical condition or other [facility] requiring further screening, evaluation, or treatment in the ED at this time prior to discharge.  The patient is safe for discharge with strict return precautions.   Final Clinical Impression(s) / ED Diagnoses Final diagnoses:  Abdominal cramping  Non-intractable vomiting with nausea, unspecified vomiting type   Disposition: Discharge  Condition: Good  I have discussed the results, Dx and Tx plan with the patient who expressed understanding and agree(s) with the plan. Discharge instructions discussed at great length. The patient was given strict return precautions who verbalized understanding of the instructions. No further questions at time of discharge.    ED Discharge Orders        Ordered    ondansetron (ZOFRAN ODT) 4 MG disintegrating tablet  Every 8 hours PRN     07/29/17 1152       Follow Up: Delorse Lek, MD [address] [place] [address] [phone]  Schedule an appointment as soon as possible for a visit  in 5-7 days, If symptoms do not improve or  worsen      This chart was dictated using voice recognition software.  Despite best efforts to proofread,  errors can occur which can change the  documentation meaning.   Nira Connardama, Arik Husmann Eduardo, MD 07/29/17 1153

## 2017-07-29 NOTE — ED Notes (Signed)
Unable to collect labs. PT currently not in waiting area  

## 2018-04-07 ENCOUNTER — Emergency Department (HOSPITAL_COMMUNITY): Payer: BLUE CROSS/BLUE SHIELD

## 2018-04-07 ENCOUNTER — Encounter (HOSPITAL_COMMUNITY): Payer: Self-pay | Admitting: Emergency Medicine

## 2018-04-07 ENCOUNTER — Other Ambulatory Visit: Payer: Self-pay

## 2018-04-07 ENCOUNTER — Emergency Department (HOSPITAL_COMMUNITY)
Admission: EM | Admit: 2018-04-07 | Discharge: 2018-04-07 | Disposition: A | Discharge status: Home / Self Care | Payer: BLUE CROSS/BLUE SHIELD | Attending: Emergency Medicine | Admitting: Emergency Medicine

## 2018-04-07 DIAGNOSIS — Z87891 Personal history of nicotine dependence: Secondary | ICD-10-CM | POA: Diagnosis not present

## 2018-04-07 DIAGNOSIS — R079 Chest pain, unspecified: Secondary | ICD-10-CM

## 2018-04-07 DIAGNOSIS — Z79899 Other long term (current) drug therapy: Secondary | ICD-10-CM | POA: Diagnosis not present

## 2018-04-07 LAB — BASIC METABOLIC PANEL
ANION GAP: 4 — AB (ref 5–15)
BUN: 12 mg/dL (ref 6–20)
CO2: 27 mmol/L (ref 22–32)
Calcium: 9.1 mg/dL (ref 8.9–10.3)
Chloride: 107 mmol/L (ref 98–111)
Creatinine, Ser: 0.79 mg/dL (ref 0.44–1.00)
GFR calc non Af Amer: >60 mL/min (ref >60)
Glucose, Bld: 115 mg/dL — ABNORMAL HIGH (ref 70–99)
Potassium: 4.3 mmol/L (ref 3.5–5.1)
SODIUM: 138 mmol/L (ref 135–145)

## 2018-04-07 LAB — I-STAT BETA HCG BLOOD, ED (MC, WL, AP ONLY): I-stat hCG, quantitative: <5 m[IU]/mL (ref <5)

## 2018-04-07 LAB — I-STAT TROPONIN, ED
TROPONIN I, POC: 0 ng/mL (ref 0.00–0.08)
Troponin i, poc: 0 ng/mL (ref 0.00–0.08)

## 2018-04-07 LAB — CBC
HEMATOCRIT: 38.7 % (ref 36.0–46.0)
Hemoglobin: 11.9 g/dL — ABNORMAL LOW (ref 12.0–15.0)
MCH: 29.6 pg (ref 26.0–34.0)
MCHC: 30.7 g/dL (ref 30.0–36.0)
MCV: 96.3 fL (ref 80.0–100.0)
Platelets: 234 10*3/uL (ref 150–400)
RBC: 4.02 MIL/uL (ref 3.87–5.11)
RDW: 13.7 % (ref 11.5–15.5)
WBC: 7.1 10*3/uL (ref 4.0–10.5)
nRBC: 0 % (ref 0.0–0.2)

## 2018-04-07 MED ORDER — METHOCARBAMOL 500 MG PO TABS: 500.0000 mg | ORAL_TABLET | Freq: Three times a day (TID) | ORAL | 0 refills | Status: AC | PRN

## 2018-04-07 MED ORDER — KETOROLAC TROMETHAMINE 60 MG/2ML IM SOLN
60.0000 mg | Freq: Once | INTRAMUSCULAR | Status: AC
  Administered 2018-04-07: 60 mg via INTRAMUSCULAR
  Filled 2018-04-07: qty 2

## 2018-04-07 MED ORDER — NAPROXEN 500 MG PO TABS: 500.0000 mg | ORAL_TABLET | Freq: Two times a day (BID) | ORAL | 0 refills | Status: AC

## 2018-04-07 NOTE — ED Triage Notes (Signed)
Pt presents with intermittent now more consistent chest pain. She reports rad to both arms. She took Tums because she thought it was gas. Denies cardiac hx, n/v. The patient is alert and oriented x4 with no acute distress at triage.

## 2018-04-07 NOTE — Discharge Instructions (Signed)
You were seen in the emergency department today for chest pain. Your work-up in the emergency department has been overall reassuring. Your labs have been fairly normal and or similar to previous blood work you have had done this includes your hemoglobin of 11.9 consistent with anemia. Your EKG and the enzyme we use to check your heart did not show an acute heart attack at this time. Your chest x-ray was normal. At this time we suspect this may be muscular in cause, we are sending you home with the following medicines:   - Naproxen is a nonsteroidal anti-inflammatory medication that will help with pain and swelling. Be sure to take this medication as prescribed with food, 1 pill every 12 hours,  It should be taken with food, as it can cause stomach upset, and more seriously, stomach bleeding. Do not take other nonsteroidal anti-inflammatory medications with this such as Advil, Motrin, Aleve, Mobic, Goodie Powder, or Motrin.    - Robaxin is the muscle relaxer I have prescribed, this is meant to help with muscle tightness. Be aware that this medication may make you drowsy therefore the first time you take this it should be at a time you are in an environment where you can rest. Do not drive or operate heavy machinery when taking this medication. Do not drink alcohol or take other sedating medications with this medicine such as narcotics or benzodiazepines.   You make take Tylenol per over the counter dosing with these medications.   We have prescribed you new medication(s) today. Discuss the medications prescribed today with your pharmacist as they can have adverse effects and interactions with your other medicines including over the counter and prescribed medications. Seek medical evaluation if you start to experience new or abnormal symptoms after taking one of these medicines, seek care immediately if you start to experience difficulty breathing, feeling of your throat closing, facial swelling, or rash as  these could be indications of a more serious allergic reaction     We would like you to follow up closely with your primary care provider and/or the cardiologist provided in your discharge instructions within 1 week. In the mean time please do not participate in exertional activity. Return to the ER immediately should you experience any new or worsening symptoms including but not limited to return of pain, worsened pain, vomiting, shortness of breath, dizziness, lightheadedness, passing out, or any other concerns that you may have.

## 2018-04-07 NOTE — ED Provider Notes (Signed)
[facility] EMERGENCY DEPARTMENT Provider Note   CSN: [id number] Arrival date & time: 04/07/18  1511     History   Chief Complaint Chief Complaint  Patient presents with  . Chest Pain    HPI Ruth TOTTON is a 37 y.o. female with a hx of anemia, irregular heart beat, and PTSD who presents to the ED with complaint of chest pain since yesterday. Patient states pain is located to the diffuse anterior chest and radiates to the bilateral shoulders. Describe it as aching/tightness. No specific alleviating/aggravating factors. Took tums without relief. She states she has 4 episodes of this yesterday lasting about 15 minutes maximum, today it has been constant since 09:30. Denies fever, chills, nausea, vomiting, diaphoresis, or dyspnea. Denies leg pain/swelling, hemoptysis, recent surgery/trauma, recent long travel, hormone use, personal hx of cancer, or personal hx of DVT/PE. Of note she has had congestion with intermittent green mucous cough for past 1-2 months. She also has not exercised in awhile and has gotten back into doing push [place].   HPI  Past Medical History:  Diagnosis Date  . Anemia   . Irregular heartbeat   . PTSD (post-traumatic stress disorder)    served in [place]    Patient Active Problem List   Diagnosis Date Noted  . Pregnancy 09/04/2016  . Cesarean delivery delivered 07/10/2013    Past Surgical History:  Procedure Laterality Date  . BREAST BIOPSY     Right breast  . BREAST BIOPSY  2003  . CESAREAN SECTION N/A 07/09/2013   Procedure: CESAREAN SECTION;  Surgeon: Turner Daniels, MD;  Location: [facility];  Service: Obstetrics;  Laterality: N/A;  . CESAREAN SECTION N/A 09/04/2016   Procedure: CESAREAN SECTION;  Surgeon: Richarda Overlie, MD;  Location: [facility] BIRTHING SUITES;  Service: Obstetrics;  Laterality: N/A;  Need RNFA - Heather K confirmed  . TENDON RECONSTRUCTION     Right thumb     OB History    Gravida  3   Para  1   Term  1   Preterm      AB  1   Living  1     SAB  1   TAB      Ectopic      Multiple      Live Births  1            Home Medications    Prior to Admission medications   Medication Sig Start Date End Date Taking? Authorizing Provider  esomeprazole (NEXIUM) 40 MG capsule Take 40 mg by mouth daily.     [provider]  ibuprofen (ADVIL,MOTRIN) 800 MG tablet Take 1 tablet (800 mg total) by mouth every 8 (eight) hours as needed for moderate pain. 09/06/16   Richarda Overlie, MD  oxyCODONE-acetaminophen (PERCOCET/ROXICET) 5-325 MG tablet Take 1 tablet by mouth every 4 (four) hours as needed (pain scale 4-7). Patient not taking: Reported on 07/29/2017 09/06/16   Richarda Overlie, MD    Family History Family History  Problem Relation Age of Onset  . Hypertension Mother   . Deep vein thrombosis Mother   . Osteopenia Mother   . Heart disease Father   . Cancer Father        lung  . Stomach cancer Sister   . Cancer Brother        brain  . Hypertension Maternal Grandmother   . Stomach cancer Maternal Grandmother     Social History Social History   Tobacco Use  .  Smoking status: Former Games developer  . Smokeless tobacco: Never Used  Substance Use Topics  . Alcohol use: Yes    Comment: occasional  . Drug use: No     Allergies   Codeine; Oxycodone; and Vicodin [hydrocodone-acetaminophen]   Review of Systems Review of Systems  Constitutional: Negative for chills, diaphoresis, fatigue and fever.  HENT: Positive for congestion.   Respiratory: Positive for cough. Negative for shortness of breath.   Cardiovascular: Positive for chest pain. Negative for palpitations and leg swelling.  Gastrointestinal: Negative for abdominal pain, diarrhea, nausea and vomiting.  Neurological: Negative for syncope, weakness and numbness.  All other systems reviewed and are negative.    Physical Exam Updated Vital Signs BP (!) 110/51 (BP Location: Right Arm)   Pulse 68   Temp 98.1 F  (36.7 C) (Oral)   Resp 18   Ht 5\' 8"  (1.727 m)   Wt 56.2 kg   LMP 03/20/2018   SpO2 100%   BMI 18.85 kg/m   Physical Exam  Constitutional: She appears well-developed and well-nourished.  Non-toxic appearance. No distress.  HENT:  Head: Normocephalic and atraumatic.  Right Ear: Tympanic membrane is not perforated, not erythematous, not retracted and not bulging.  Left Ear: Tympanic membrane is not perforated, not erythematous, not retracted and not bulging.  Mouth/Throat: Uvula is midline and oropharynx is clear and moist. No oropharyngeal exudate or posterior oropharyngeal erythema.  Eyes: Pupils are equal, round, and reactive to light. Conjunctivae are normal. Right eye exhibits no discharge. Left eye exhibits no discharge.  Neck: Normal range of motion. Neck supple.  Cardiovascular: Normal rate and regular rhythm.  No murmur heard. Pulses:      Radial pulses are 2+ on the right side, and 2+ on the left side.  Pulmonary/Chest: Effort normal and breath sounds normal. No respiratory distress. She has no wheezes. She has no rhonchi. She has no rales. She exhibits tenderness (mild diffuse anterior chest wal). She exhibits no laceration, no crepitus, no edema, no deformity, no swelling and no retraction.  Respiration even and unlabored. No overlying skin changes.   Abdominal: Soft. She exhibits no distension. There is no tenderness.  Musculoskeletal: She exhibits no edema or tenderness.  Lymphadenopathy:    She has no cervical adenopathy.  Neurological: She is alert.  Clear speech.   Skin: Skin is warm and dry. No rash noted.  Psychiatric: She has a normal mood and affect. Her behavior is normal.  Nursing note and vitals reviewed.    ED Treatments / Results  Labs (all labs ordered are listed, but only abnormal results are displayed) Labs Reviewed  BASIC METABOLIC PANEL - Abnormal; Notable for the following components:      Result Value   Glucose, Bld 115 (*)    Anion gap 4  (*)    All other components within normal limits  CBC - Abnormal; Notable for the following components:   Hemoglobin 11.9 (*)    All other components within normal limits  I-STAT TROPONIN, ED  I-STAT BETA HCG BLOOD, ED ([facility], [facility], [facility] ONLY)  I-STAT TROPONIN, ED    EKG EKG Interpretation  Date/Time:  Thursday April 07 2018 15:18:22 EDT Ventricular Rate:  79 PR Interval:  112 QRS Duration: 74 QT Interval:  386 QTC Calculation: 442 R Axis:   90 Text Interpretation:  Normal sinus rhythm Rightward axis Borderline ECG When compared to prior, no significant changes seen.  No STEMI Confirmed by Theda Belfast ([id number]) on 04/07/2018 5:48:47 PM  Radiology Dg Chest 2 View  Result Date: 04/07/2018 CLINICAL DATA:  Chest pain. EXAM: CHEST - 2 VIEW COMPARISON:  November 27, 2015 FINDINGS: The heart size and mediastinal contours are within normal limits. Both lungs are clear. The visualized skeletal structures are unremarkable. IMPRESSION: No active cardiopulmonary disease. Electronically Signed   By: Gerome Sam III M.D   On: 04/07/2018 15:56    Procedures Procedures (including critical care time)  Medications Ordered in ED Medications - No data to display   Initial Impression / Assessment and Plan / ED Course  I have reviewed the triage vital signs and the nursing notes.  Pertinent labs & imaging results that were available during my care of the patient were reviewed by me and considered in my medical decision making (see chart for details).    Patient presents to the emergency department with chest pain. Patient nontoxic appearing, in no apparent distress, vitals without significant abnormality. Fairly benign physical exam, pain reproducible with anterior chest wall palpation, no overlying skin changes or palpable crepitus/deformity. DDX: ACS, pulmonary embolism, dissection, pneumothorax, effusion, infiltrate,anemia, electrolyte derangement, MSK, GERD. Evaluation initiated with labs, EKG,  and CXR.   Work-up in the ER unremarkable. Labs reviewed: Hgb of 11.9 similar to prior ranges on record, no leukocytosis or significant electrolyte abnormality. CXR without infiltrate, effusion, pneumothorax, or fracture/dislocation.   Low risk heart score, EKG without obvious ischemia, delta troponin negative, doubt ACS. Patient is low risk wells, PERC negative, doubt pulmonary embolism. Pain is not a tearing sensation, symmetric pulses, no widening of mediastinum on CXR, doubt dissection. Given reproducibility with recent coughing and resumption of pushups feel MSK etiology is most likely. Patient has appeared hemodynamically stable throughout ER visit and appears safe for discharge with close PCP follow up. Will provide prescriptions for naproxen/robaxin- instructed not to drive or operate heavy machinery when taking robaxin. I discussed results, treatment plan, need for PCP follow-up, and return precautions with the patient. Provided opportunity for questions, patient confirmed understanding and is in agreement with plan.   Final Clinical Impressions(s) / ED Diagnoses   Final diagnoses:  Chest pain, unspecified type    ED Discharge Orders         Ordered    naproxen (NAPROSYN) 500 MG tablet  2 times daily     04/07/18 2011    methocarbamol (ROBAXIN) 500 MG tablet  Every 8 hours PRN     04/07/18 2011           Cherly Anderson, [place] 04/07/18 2013    Tegeler, Canary Brim, MD 04/08/18 [phone]

## 2018-08-18 ENCOUNTER — Emergency Department (HOSPITAL_COMMUNITY): Payer: BLUE CROSS/BLUE SHIELD

## 2018-08-18 ENCOUNTER — Encounter (HOSPITAL_COMMUNITY): Payer: Self-pay | Admitting: *Deleted

## 2018-08-18 ENCOUNTER — Emergency Department (HOSPITAL_COMMUNITY)
Admission: EM | Admit: 2018-08-18 | Discharge: 2018-08-18 | Disposition: A | Discharge status: Home / Self Care | Payer: BLUE CROSS/BLUE SHIELD | Attending: Emergency Medicine | Admitting: Emergency Medicine

## 2018-08-18 ENCOUNTER — Other Ambulatory Visit: Payer: Self-pay

## 2018-08-18 DIAGNOSIS — Y929 Unspecified place or not applicable: Secondary | ICD-10-CM | POA: Diagnosis not present

## 2018-08-18 DIAGNOSIS — S22089A Unspecified fracture of T11-T12 vertebra, initial encounter for closed fracture: Secondary | ICD-10-CM | POA: Diagnosis not present

## 2018-08-18 DIAGNOSIS — Z87891 Personal history of nicotine dependence: Secondary | ICD-10-CM | POA: Insufficient documentation

## 2018-08-18 DIAGNOSIS — W230XXA Caught, crushed, jammed, or pinched between moving objects, initial encounter: Secondary | ICD-10-CM | POA: Insufficient documentation

## 2018-08-18 DIAGNOSIS — Z79899 Other long term (current) drug therapy: Secondary | ICD-10-CM | POA: Diagnosis not present

## 2018-08-18 DIAGNOSIS — Y939 Activity, unspecified: Secondary | ICD-10-CM | POA: Diagnosis not present

## 2018-08-18 DIAGNOSIS — Y999 Unspecified external cause status: Secondary | ICD-10-CM | POA: Insufficient documentation

## 2018-08-18 MED ORDER — IBUPROFEN 800 MG PO TABS: 800.0000 mg | ORAL_TABLET | Freq: Three times a day (TID) | ORAL | 0 refills | Status: AC | PRN

## 2018-08-18 MED ORDER — IBUPROFEN 800 MG PO TABS
800.0000 mg | ORAL_TABLET | Freq: Once | ORAL | Status: AC
  Administered 2018-08-18: 800 mg via ORAL
  Filled 2018-08-18: qty 1

## 2018-08-18 MED ORDER — ONDANSETRON HCL 4 MG/2ML IJ SOLN
4.0000 mg | Freq: Once | INTRAMUSCULAR | Status: AC
  Administered 2018-08-18: 4 mg via INTRAVENOUS
  Filled 2018-08-18: qty 2

## 2018-08-18 MED ORDER — ONDANSETRON 4 MG PO TBDP
4.0000 mg | ORAL_TABLET | Freq: Once | ORAL | Status: AC
  Administered 2018-08-18: 4 mg via ORAL
  Filled 2018-08-18: qty 1

## 2018-08-18 MED ORDER — ACETAMINOPHEN 325 MG PO TABS
650.0000 mg | ORAL_TABLET | Freq: Once | ORAL | Status: AC
  Administered 2018-08-18: 650 mg via ORAL
  Filled 2018-08-18: qty 2

## 2018-08-18 MED ORDER — ONDANSETRON 4 MG PO TBDP: 4.0000 mg | ORAL_TABLET | Freq: Three times a day (TID) | ORAL | 0 refills | Status: AC | PRN

## 2018-08-18 NOTE — ED Triage Notes (Signed)
Pt was participating in the training for [employer] this morning . Pt fell on to her back while pulling a dummy . Pt reports back  And neck pain. Pt transported via EMS to ED . Pt AO on arrival To ED.

## 2018-08-18 NOTE — ED Notes (Signed)
Ortho tech made aware of need for TLSO brace

## 2018-08-18 NOTE — ED Notes (Addendum)
Patient verbalizes understanding of discharge instructions. Opportunity for questioning and answers were provided. Armband removed by staff, pt discharged from ED. TLSO brace applied by biomed. Follow up care and prescriptions reviewed.

## 2018-08-18 NOTE — ED Notes (Signed)
Biomed at bedside applying TLSO.
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
## 2018-08-18 NOTE — ED Notes (Signed)
Patient transported to CT 

## 2018-08-18 NOTE — ED Notes (Signed)
ED Provider at bedside. 

## 2018-08-18 NOTE — Discharge Instructions (Addendum)
You may wear the brace as needed for comfort.

## 2018-08-18 NOTE — ED Notes (Signed)
Patient transported to MRI 

## 2018-08-18 NOTE — Progress Notes (Signed)
Orthopedic Tech Progress Note Patient Details:  [name] [birth year] [id number] Called in Brace to [employer]- [employer] Patient ID: [name], female   DOB: Aug 24, 1980, 38 y.o.   MRN: [id number]   [name] 08/18/2018, 3:36 PM

## 2018-11-04 ENCOUNTER — Other Ambulatory Visit: Payer: Self-pay | Admitting: Student

## 2018-11-04 DIAGNOSIS — S22080A Wedge compression fracture of T11-T12 vertebra, initial encounter for closed fracture: Secondary | ICD-10-CM

## 2018-11-15 ENCOUNTER — Ambulatory Visit
Admission: RE | Admit: 2018-11-15 | Discharge: 2018-11-15 | Disposition: A | Discharge status: Home / Self Care | Payer: BC Managed Care – PPO | Source: Ambulatory Visit | Attending: Student | Admitting: Student

## 2018-11-15 DIAGNOSIS — S22080A Wedge compression fracture of T11-T12 vertebra, initial encounter for closed fracture: Secondary | ICD-10-CM

## 2019-08-24 ENCOUNTER — Ambulatory Visit: Payer: BC Managed Care – PPO | Attending: Family

## 2019-08-24 DIAGNOSIS — Z23 Encounter for immunization: Secondary | ICD-10-CM

## 2019-08-24 NOTE — Progress Notes (Signed)
   Covid-19 Vaccination Clinic  Name:  [name]    MRN: [id number] DOB: 1980/09/02  08/24/2019  [name] was observed post Covid-19 immunization for 15 minutes without incident. She was provided with Vaccine Information Sheet and instruction to access the V-Safe system.   [name] was instructed to call 911 with any severe reactions post vaccine: [name] Difficulty breathing  . Swelling of face and throat  . A fast heartbeat  . A bad rash all over body  . Dizziness and weakness   Immunizations Administered    Name Date Dose VIS Date Route   Moderna COVID-19 Vaccine 08/24/2019 11:36 AM 0.5 mL 05/16/2019 Intramuscular   Manufacturer: Moderna   Lot: [id number]   NDC: 73578-978-47

## 2019-08-25 ENCOUNTER — Other Ambulatory Visit: Payer: Self-pay | Admitting: Nurse Practitioner

## 2019-08-25 DIAGNOSIS — Z1231 Encounter for screening mammogram for malignant neoplasm of breast: Secondary | ICD-10-CM

## 2019-09-26 ENCOUNTER — Ambulatory Visit: Payer: BC Managed Care – PPO | Attending: Family

## 2019-09-26 DIAGNOSIS — Z23 Encounter for immunization: Secondary | ICD-10-CM

## 2019-09-26 NOTE — Progress Notes (Signed)
   Covid-19 Vaccination Clinic  Name:  [name]    MRN: [id number] DOB: January 17, 1981  09/26/2019  [name] was observed post Covid-19 immunization for 15 minutes without incident. She was provided with Vaccine Information Sheet and instruction to access the V-Safe system.   [name] was instructed to call 911 with any severe reactions post vaccine: [name] Difficulty breathing  . Swelling of face and throat  . A fast heartbeat  . A bad rash all over body  . Dizziness and weakness   Immunizations Administered    Name Date Dose VIS Date Route   Moderna COVID-19 Vaccine 09/26/2019  2:57 PM 0.5 mL 05/16/2019 Intramuscular   Manufacturer: Moderna   Lot: [id number]   NDC: 63868-548-83

## 2019-10-19 IMAGING — CT CT THORACIC SPINE WITHOUT CONTRAST
3 of 4 series · 12 of 33 positions shown, 14 images · non-contrast
Comparison: Thoracic spine MRI and lumbar spine CT 08/18/2018.

CLINICAL DATA: 37-year-old female with fall related T12 compression
fracture in [REDACTED]. Persistent back pain.

EXAM:
CT THORACIC SPINE WITHOUT CONTRAST
TECHNIQUE: Multidetector CT images of the thoracic were obtained using the
standard protocol without intravenous contrast.

[Series 3: t-spine 2.00 br40 s3 ax · axial · 0.27mm/px · z∈[+1460,+1684]mm · 5 of 170 slices shown, 7 images]
[im 29/170  soft-tissue]
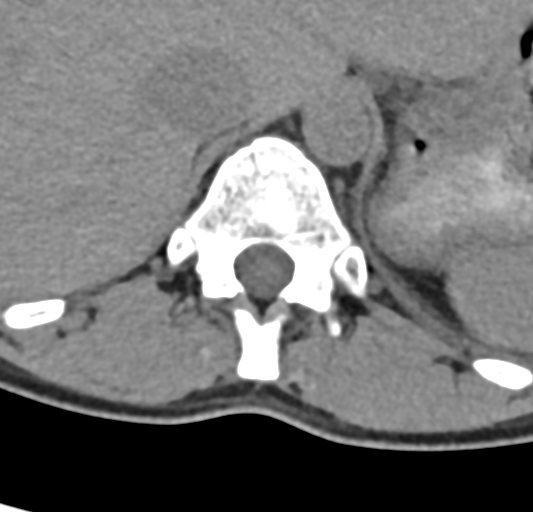
[im 29/170  bone]
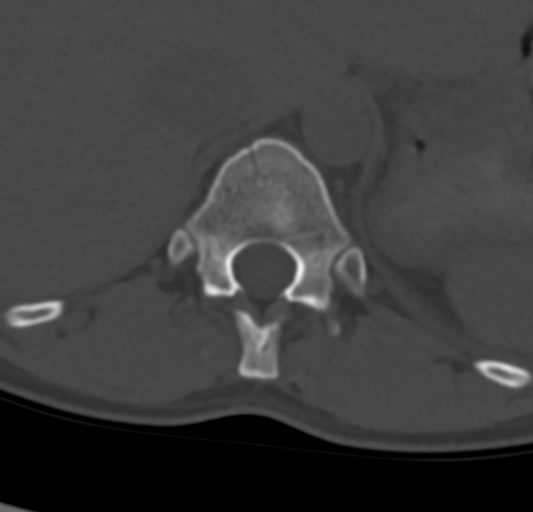
[im 57/170  bone]
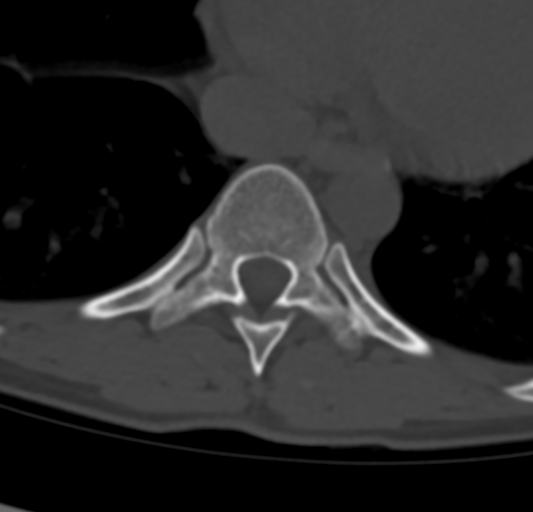
[im 85/170  bone]
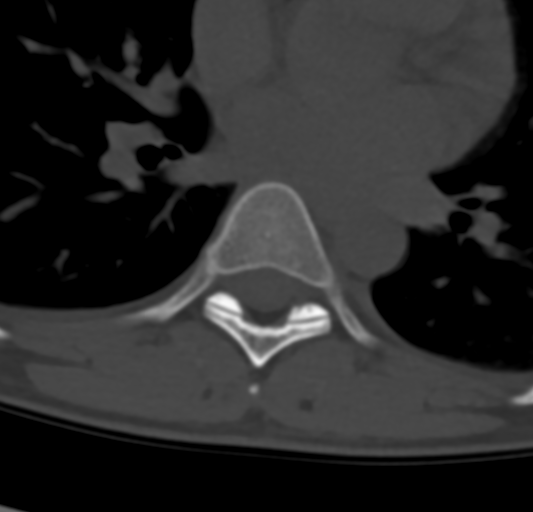
[im 113/170  bone]
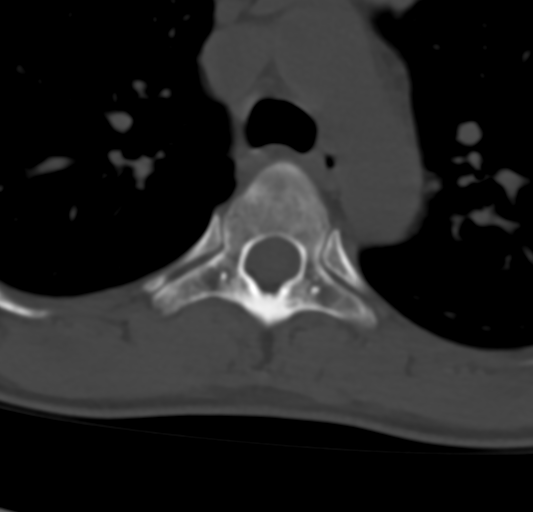
[im 141/170  soft-tissue]
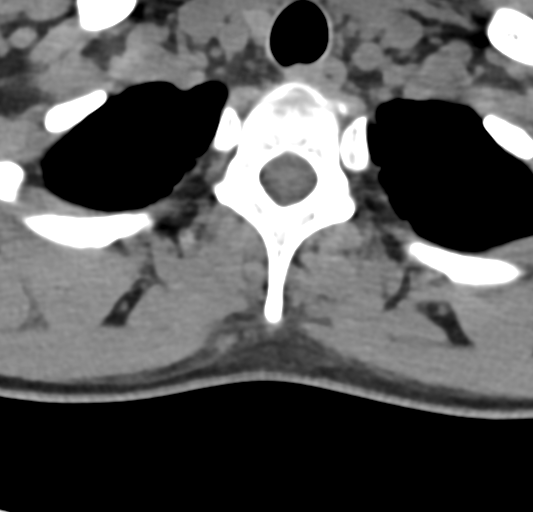
[im 141/170  bone]
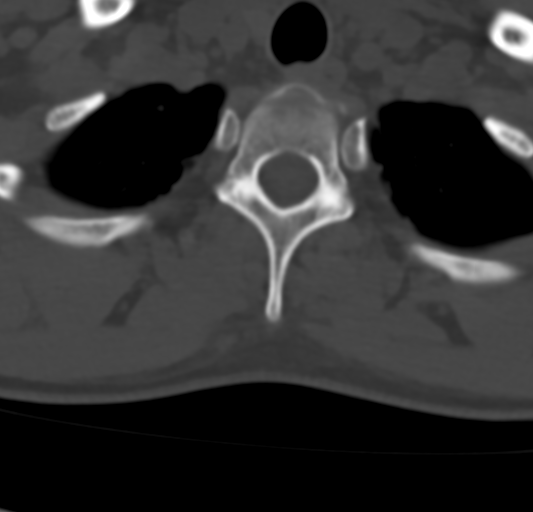

[Series 5: t-spine 2.00 br60 s3 ax · axial · 0.27mm/px · z∈[+1460,+1628]mm · 4 of 170 slices shown]
[im 29/170  bone]
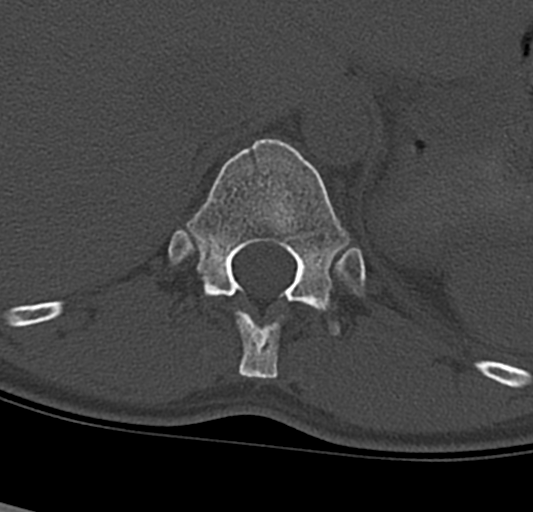
[im 57/170  bone]
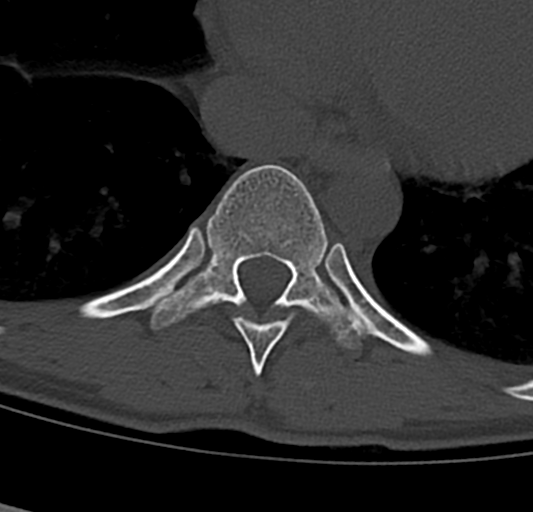
[im 85/170  bone]
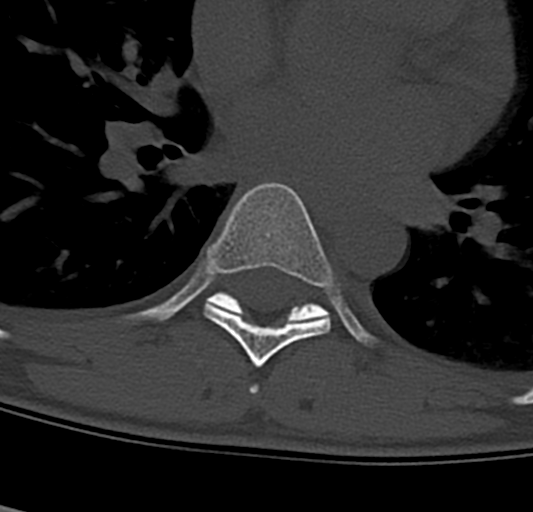
[im 113/170  bone]
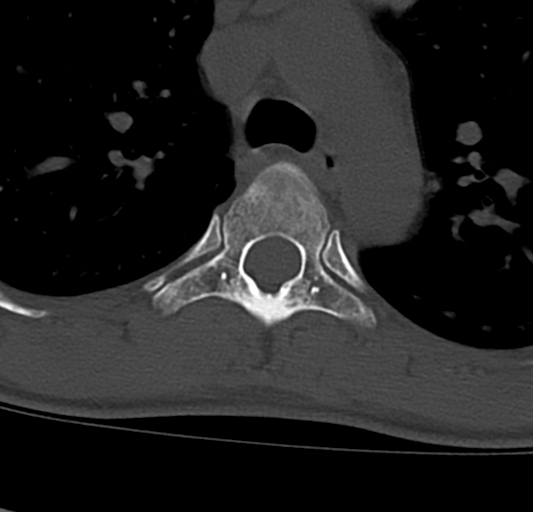

[Series 9: t-spine 2.00 br60 s3 cor · coronal · 0.28mm/px · 3 of 68 slices shown]
[im 14/68  bone]
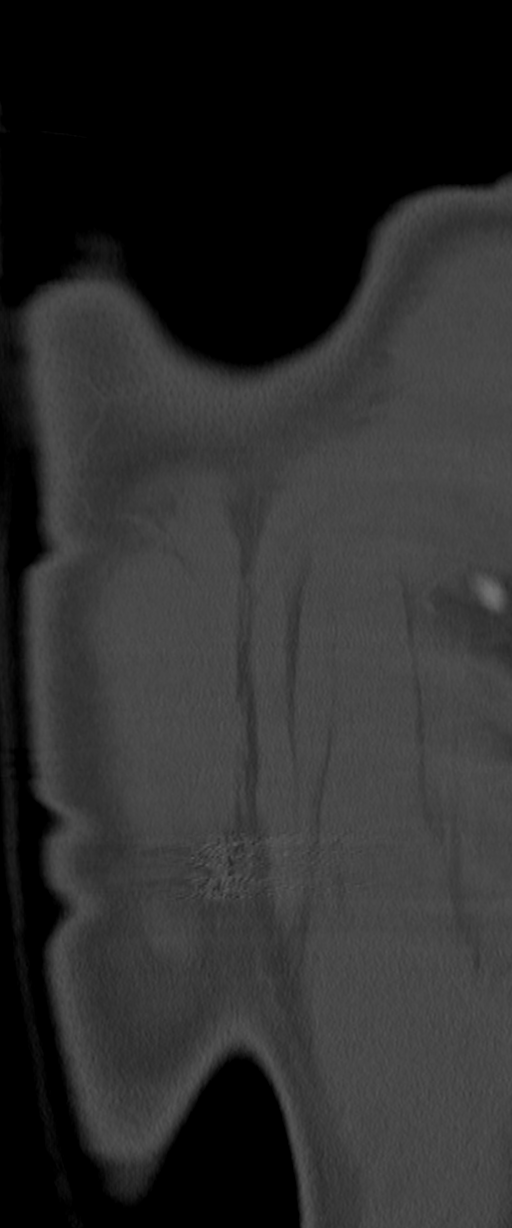
[im 27/68  bone]
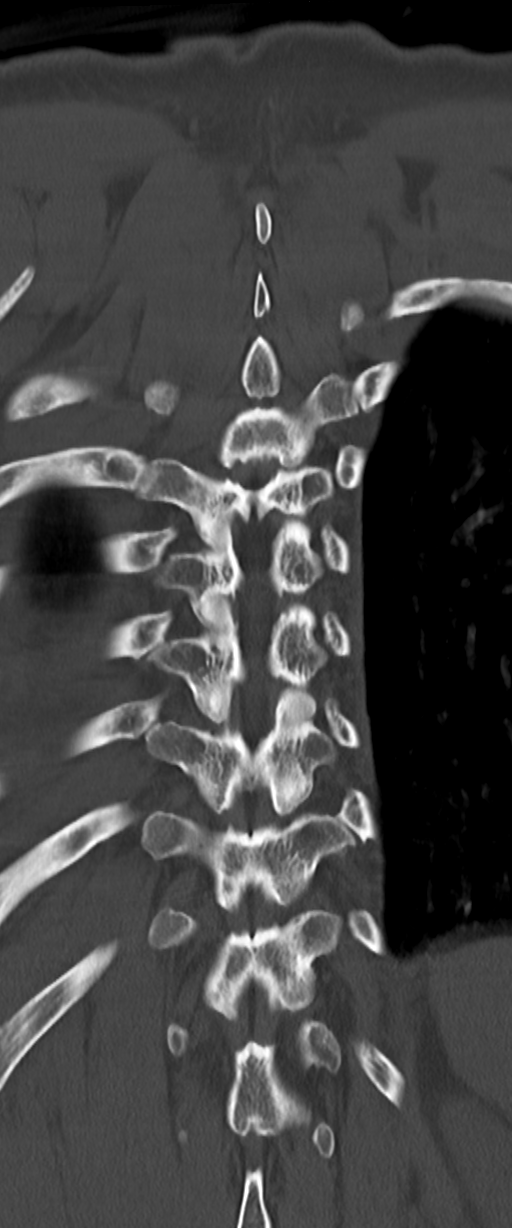
[im 41/68  bone]
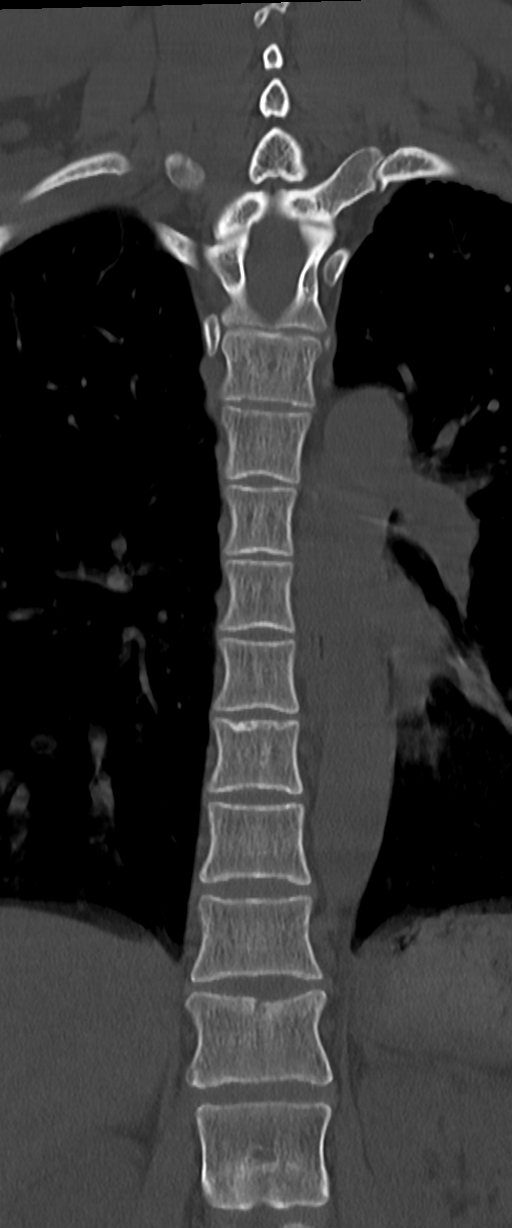

[12 of 33 positions shown; findings below may reference images not displayed]

FINDINGS: Segmentation: Normal.

Alignment: Stable since [REDACTED] with no spondylolisthesis and
relatively preserved thoracic kyphosis.

Vertebrae: Mild compression fracture of the T12 superior endplate
with stable slight loss of vertebral body height since the [REDACTED] CT.
Sclerosis has developed along the superior endplate deformity along
with a posteriorly situated Schmorl's node (series 7, image 38). No
retropulsion. The T12 pedicles and posterior elements remain intact.

Other thoracic vertebrae appear stable and intact. The L1 level
appears intact. No acute osseous abnormality identified.

Paraspinal and other soft tissues: Visible major airways and lungs
are clear. No pericardial or pleural effusion. Negative visible
noncontrast mediastinum and upper abdominal viscera. Negative
paraspinal soft tissues.

Disc levels: Stable since [REDACTED].  No thoracic spinal stenosis.
IMPRESSION: 1. Healing T12 superior endplate compression with no loss of
vertebral body height since 08/18/2018. A small Schmorl's node has
developed along the posterior endplate fracture.
2. No new abnormality identified in the thoracic spine.

## 2020-06-28 ENCOUNTER — Other Ambulatory Visit: Payer: BC Managed Care – PPO

## 2020-06-28 DIAGNOSIS — Z20822 Contact with and (suspected) exposure to covid-19: Secondary | ICD-10-CM

## 2020-06-30 LAB — SARS-COV-2, NAA 2 DAY TAT

## 2020-06-30 LAB — NOVEL CORONAVIRUS, NAA: SARS-CoV-2, NAA: DETECTED — AB

## 2020-07-02 ENCOUNTER — Telehealth: Payer: Self-pay | Admitting: Family Medicine

## 2020-07-02 NOTE — Telephone Encounter (Signed)
Attempted to call patient regarding her concerns regarding COVID isolation. (She does have an appointment tomorrow with her PCP.) Left message for patient to return call.

## 2020-07-02 NOTE — Telephone Encounter (Signed)
Copied from CRM [id number]. 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 Please call Ph# [phone]

## 2020-07-03 ENCOUNTER — Other Ambulatory Visit: Payer: BC Managed Care – PPO

## 2021-09-21 ENCOUNTER — Encounter: Payer: Self-pay | Admitting: Emergency Medicine

## 2021-09-21 ENCOUNTER — Ambulatory Visit
Admission: EM | Admit: 2021-09-21 | Discharge: 2021-09-21 | Disposition: A | Discharge status: Home / Self Care | Payer: BC Managed Care – PPO | Attending: Emergency Medicine | Admitting: Emergency Medicine

## 2021-09-21 DIAGNOSIS — J020 Streptococcal pharyngitis: Secondary | ICD-10-CM

## 2021-09-21 LAB — POCT RAPID STREP A (OFFICE): Rapid Strep A Screen: POSITIVE — AB

## 2021-09-21 MED ORDER — AMOXICILLIN 500 MG PO CAPS: 500.0000 mg | ORAL_CAPSULE | Freq: Two times a day (BID) | ORAL | 0 refills | Status: AC

## 2021-09-21 NOTE — ED Provider Notes (Signed)
?[facility] ? ? ? ?CSN: [id number] ?Arrival date & time: 09/21/21  [id number] ? ? ?  ? ?History   ?Chief Complaint ?Chief Complaint  ?Patient presents with  ? Sore Throat  ? ? ?HPI ?[name] is a 41 y.o. female.  Patient presents with 1 day history of sore throat.  She denies fever, chills, rash, cough, or other symptoms.  No OTC medications taken today.  Her medical history includes anemia, irregular heartbeat, PTSD. ? ?The history is provided by the patient and medical records.  ? ?Past Medical History:  ?Diagnosis Date  ? Anemia   ? Irregular heartbeat   ? PTSD (post-traumatic stress disorder)   ? [place]  ? ? ?Patient Active Problem List  ? Diagnosis Date Noted  ? Pregnancy 09/04/2016  ? Cesarean delivery delivered 07/10/2013  ? ? ?Past Surgical History:  ?Procedure Laterality Date  ? BREAST BIOPSY    ? Right breast  ? BREAST BIOPSY  2003  ? CESAREAN SECTION N/A 07/09/2013  ? Procedure: CESAREAN SECTION;  Surgeon: Turner Danielsavid C Lowe, MD;  Location: [facility];  Service: Obstetrics;  Laterality: N/A;  ? CESAREAN SECTION N/A 09/04/2016  ? Procedure: CESAREAN SECTION;  Surgeon: Richarda Overlieichard Holland, MD;  [facility];  Service: Obstetrics;  Laterality: N/A;  Need RNFA - Herbert SetaHeather K confirmed  ? TENDON RECONSTRUCTION    ? Right thumb  ? ? ?OB History   ? ? Gravida  ?3  ? Para  ?1  ? Term  ?1  ? Preterm  ?   ? AB  ?1  ? Living  ?1  ?  ? ? SAB  ?1  ? IAB  ?   ? Ectopic  ?   ? Multiple  ?   ? Live Births  ?1  ?   ?  ?  ? ? ? ?Home Medications   ? ?Prior to Admission medications   ?Medication Sig Start Date End Date Taking? Authorizing Provider  ?amoxicillin (AMOXIL) 500 MG capsule Take 1 capsule (500 mg total) by mouth 2 (two) times daily for 10 days. 09/21/21 10/01/21 Yes Mickie Bailate, Harry Bark H, NP  ?esomeprazole (NEXIUM) 40 MG capsule Take 40 mg by mouth daily.     [provider]  ?ibuprofen (ADVIL,MOTRIN) 800 MG tablet Take 1 tablet (800 mg total) by mouth every 8 (eight) hours as needed. 08/18/18    Tilden Fossaees, Elizabeth, MD  ?methocarbamol (ROBAXIN) 500 MG tablet Take 1 tablet (500 mg total) by mouth every 8 (eight) hours as needed. 04/07/18   Petrucelli, Samantha R, PA-C  ?naproxen (NAPROSYN) 500 MG tablet Take 1 tablet (500 mg total) by mouth 2 (two) times daily. 04/07/18   Petrucelli, Samantha R, PA-C  ?ondansetron (ZOFRAN ODT) 4 MG disintegrating tablet Take 1 tablet (4 mg total) by mouth every 8 (eight) hours as needed for nausea or vomiting. 08/18/18   Tilden Fossaees, Elizabeth, MD  ? ? ?Family History ?Family History  ?Problem Relation Age of Onset  ? Hypertension Mother   ? Deep vein thrombosis Mother   ? Osteopenia Mother   ? Heart disease Father   ? Cancer Father   ?     lung  ? Stomach cancer Sister   ? Cancer Brother   ?     brain  ? Hypertension Maternal Grandmother   ? Stomach cancer Maternal Grandmother   ? ? ?Social History ?Social History  ? ?Tobacco Use  ? Smoking status: Former  ? Smokeless tobacco: Never  ?  Vaping Use  ? Vaping Use: Never used  ?Substance Use Topics  ? Alcohol use: Yes  ?  Comment: occasional  ? Drug use: No  ? ? ? ?Allergies   ?Codeine, Oxycodone, and Vicodin [hydrocodone-acetaminophen] ? ? ?Review of Systems ?Review of Systems  ?Constitutional:  Negative for chills and fever.  ?HENT:  Positive for sore throat. Negative for ear pain, trouble swallowing and voice change.   ?Respiratory:  Negative for cough and shortness of breath.   ?Gastrointestinal:  Negative for diarrhea and vomiting.  ?Skin:  Negative for color change and rash.  ?All other systems reviewed and are negative. ? ? ?Physical Exam ?Triage Vital Signs ?ED Triage Vitals [09/21/21 0830]  ?Enc Vitals Group  ?   BP 122/72  ?   Pulse Rate 78  ?   Resp 18  ?   Temp 98.2 ?F (36.8 ?C)  ?   Temp src   ?   SpO2 98 %  ?   Weight   ?   Height   ?   Head Circumference   ?   Peak Flow   ?   Pain Score   ?   Pain Loc   ?   Pain Edu?   ?   Excl. in GC?   ? ?No data found. ? ?Updated Vital Signs ?BP 122/72   Pulse 78   Temp 98.2 ?F (36.8 ?C)    Resp 18   LMP 09/14/2021 (Approximate)   SpO2 98%   Breastfeeding No  ? ?Visual Acuity ?Right Eye Distance:   ?Left Eye Distance:   ?Bilateral Distance:   ? ?Right Eye Near:   ?Left Eye Near:    ?Bilateral Near:    ? ?Physical Exam ?Vitals and nursing note reviewed.  ?Constitutional:   ?   General: She is not in acute distress. ?   Appearance: Normal appearance. She is well-developed. She is not ill-appearing.  ?HENT:  ?   Right Ear: Tympanic membrane normal.  ?   Left Ear: Tympanic membrane normal.  ?   Nose: Nose normal.  ?   Mouth/Throat:  ?   Mouth: Mucous membranes are moist.  ?   Pharynx: Posterior oropharyngeal erythema present.  ?Cardiovascular:  ?   Rate and Rhythm: Normal rate and regular rhythm.  ?   Heart sounds: Normal heart sounds.  ?Pulmonary:  ?   Effort: Pulmonary effort is normal. No respiratory distress.  ?   Breath sounds: Normal breath sounds.  ?Musculoskeletal:  ?   Cervical back: Neck supple.  ?Skin: ?   General: Skin is warm and dry.  ?Neurological:  ?   Mental Status: She is alert.  ?Psychiatric:     ?   Mood and Affect: Mood normal.     ?   Behavior: Behavior normal.  ? ? ? ?UC Treatments / Results  ?Labs ?(all labs ordered are listed, but only abnormal results are displayed) ?Labs Reviewed  ?POCT RAPID STREP A (OFFICE) - Abnormal; Notable for the following components:  ?    Result Value  ? Rapid Strep A Screen Positive (*)   ? All other components within normal limits  ? ? ?EKG ? ? ?Radiology ?No results found. ? ?Procedures ?Procedures (including critical care time) ? ?Medications Ordered in UC ?Medications - No data to display ? ?Initial Impression / Assessment and Plan / UC Course  ?I have reviewed the triage vital signs and the nursing notes. ? ?Pertinent labs & imaging results that  were available during my care of the patient were reviewed by me and considered in my medical decision making (see chart for details). ? ?  ?Strep pharyngitis.  Rapid strep positive.  Treating with  amoxicillin.  Or ibuprofen as needed.  Education provided on strep throat.  Instructed patient to follow up with her PCP if her symptoms are not improving.  She agrees to plan of care.  ? ? ?Final Clinical Impressions(s) / UC Diagnoses  ? ?Final diagnoses:  ?Strep pharyngitis  ? ? ? ?Discharge Instructions   ? ?  ?You have strep throat.  Take the amoxicillin as directed.  Follow up with your primary care provider if your symptoms are not improving.   ? ? ? ? ? ?ED Prescriptions   ? ? Medication Sig Dispense Auth. Provider  ? amoxicillin (AMOXIL) 500 MG capsule Take 1 capsule (500 mg total) by mouth 2 (two) times daily for 10 days. 20 capsule Mickie Bail, NP  ? ?  ? ?PDMP not reviewed this encounter. ?  ?Mickie Bail, NP ?09/21/21 [phone] ? ?

## 2021-09-21 NOTE — ED Triage Notes (Signed)
Pt here with painful, red, swollen throat since yesterday morning.  ?

## 2021-09-21 NOTE — Discharge Instructions (Addendum)
You have strep throat.  Take the amoxicillin as directed.  Follow up with your primary care provider if your symptoms are not improving.   ? ?

## 2021-10-07 ENCOUNTER — Ambulatory Visit
Admission: EM | Admit: 2021-10-07 | Discharge: 2021-10-07 | Disposition: A | Discharge status: Home / Self Care | Payer: BC Managed Care – PPO

## 2021-10-07 ENCOUNTER — Encounter: Payer: Self-pay | Admitting: Emergency Medicine

## 2021-10-07 DIAGNOSIS — J020 Streptococcal pharyngitis: Secondary | ICD-10-CM

## 2021-10-07 LAB — POCT RAPID STREP A (OFFICE): Rapid Strep A Screen: POSITIVE — AB

## 2021-10-07 MED ORDER — AMOXICILLIN-POT CLAVULANATE 875-125 MG PO TABS: 1.0000 | ORAL_TABLET | Freq: Two times a day (BID) | ORAL | 0 refills | Status: AC

## 2021-10-07 NOTE — ED Provider Notes (Signed)
?[facility] ? ? ? ?CSN: [id number] ?Arrival date & time: 10/07/21  0920 ? ? ?  ? ?History   ?Chief Complaint ?Chief Complaint  ?Patient presents with  ? Sore Throat  ? ? ?HPI ?[name] is a 41 y.o. female.  ? ?Patient presents with erythema and pain to the throat and tonsillar adenopathy beginning this morning.  Has not attempted to eat or drink today but has used Tylenol which provided minimal comfort.  Recently treated for strep 2 weeks ago with amoxicillin, last dose 3 days ago.  Endorses that all symptoms had seemed to resolve.  Denies fever, chills, body aches, nasal congestion, rhinorrhea, cough, shortness of breath, wheezing, difficulty swallowing. ? ?Past Medical History:  ?Diagnosis Date  ? Anemia   ? Irregular heartbeat   ? PTSD (post-traumatic stress disorder)   ? served in [place]  ? ? ?Patient Active Problem List  ? Diagnosis Date Noted  ? Pregnancy 09/04/2016  ? Cesarean delivery delivered 07/10/2013  ? ? ?Past Surgical History:  ?Procedure Laterality Date  ? BREAST BIOPSY    ? Right breast  ? BREAST BIOPSY  2003  ? CESAREAN SECTION N/A 07/09/2013  ? Procedure: CESAREAN SECTION;  Surgeon: Turner Daniels, MD;  Location: [facility];  Service: Obstetrics;  Laterality: N/A;  ? CESAREAN SECTION N/A 09/04/2016  ? Procedure: CESAREAN SECTION;  Surgeon: Richarda Overlie, MD;  Location: [facility];  Service: Obstetrics;  Laterality: N/A;  Need RNFA - Herbert Seta K confirmed  ? TENDON RECONSTRUCTION    ? Right thumb  ? ? ?OB History   ? ? Gravida  ?3  ? Para  ?1  ? Term  ?1  ? Preterm  ?   ? AB  ?1  ? Living  ?1  ?  ? ? SAB  ?1  ? IAB  ?   ? Ectopic  ?   ? Multiple  ?   ? Live Births  ?1  ?   ?  ?  ? ? ? ?Home Medications   ? ?Prior to Admission medications   ?Medication Sig Start Date End Date Taking? Authorizing Provider  ?amoxicillin-clavulanate (AUGMENTIN) 875-125 MG tablet Take 1 tablet by mouth every 12 (twelve) hours for 10 days. 10/07/21 10/17/21 Yes Zarahi Fuerst, Elita Boone, NP  ?esomeprazole  (NEXIUM) 40 MG capsule Take 40 mg by mouth daily.    Yes [provider]  ?ibuprofen (ADVIL,MOTRIN) 800 MG tablet Take 1 tablet (800 mg total) by mouth every 8 (eight) hours as needed. 08/18/18  Yes Tilden Fossa, MD  ?rOPINIRole (REQUIP) 0.5 MG tablet Take 1/2 tablet by mouth once daily at bedtime for 1 week, then increase the dose to 1 tablet by mouth once daily at bedtime. 10/09/20  Yes [provider]  ?methocarbamol (ROBAXIN) 500 MG tablet Take 1 tablet (500 mg total) by mouth every 8 (eight) hours as needed. 04/07/18   Petrucelli, Pleas Koch, PA-C  ?Multiple Vitamin (MULTI-VITAMIN) tablet Take 1 tablet by mouth daily.    [provider]  ?naproxen (NAPROSYN) 500 MG tablet Take 1 tablet (500 mg total) by mouth 2 (two) times daily. 04/07/18   Petrucelli, Samantha R, PA-C  ?ondansetron (ZOFRAN ODT) 4 MG disintegrating tablet Take 1 tablet (4 mg total) by mouth every 8 (eight) hours as needed for nausea or vomiting. 08/18/18   Tilden Fossa, MD  ? ? ?Family History ?Family History  ?Problem Relation Age of Onset  ? Hypertension Mother   ? Deep vein  thrombosis Mother   ? Osteopenia Mother   ? Heart disease Father   ? Cancer Father   ?     lung  ? Stomach cancer Sister   ? Cancer Brother   ?     brain  ? Hypertension Maternal Grandmother   ? Stomach cancer Maternal Grandmother   ? ? ?Social History ?Social History  ? ?Tobacco Use  ? Smoking status: Former  ? Smokeless tobacco: Never  ?Vaping Use  ? Vaping Use: Never used  ?Substance Use Topics  ? Alcohol use: Yes  ?  Comment: occasional  ? Drug use: No  ? ? ? ?Allergies   ?Codeine, Oxycodone, and Vicodin [hydrocodone-acetaminophen] ? ? ?Review of Systems ?Review of Systems  ?Constitutional: Negative.   ?HENT:  Positive for sore throat. Negative for congestion, dental problem, drooling, ear discharge, ear pain, facial swelling, hearing loss, mouth sores, nosebleeds, postnasal drip, rhinorrhea, sinus pressure, sinus pain, sneezing, tinnitus,  trouble swallowing and voice change.   ?Respiratory: Negative.    ?Cardiovascular: Negative.   ? ? ?Physical Exam ?Triage Vital Signs ?ED Triage Vitals  ?Enc Vitals Group  ?   BP 10/07/21 0940 111/72  ?   Pulse Rate 10/07/21 0940 72  ?   Resp 10/07/21 0940 16  ?   Temp 10/07/21 0940 98.4 ?F (36.9 ?C)  ?   Temp Source 10/07/21 0940 Oral  ?   SpO2 10/07/21 0940 99 %  ?   Weight --   ?   Height --   ?   Head Circumference --   ?   Peak Flow --   ?   Pain Score 10/07/21 0947 6  ?   Pain Loc --   ?   Pain Edu? --   ?   Excl. in GC? --   ? ?No data found. ? ?Updated Vital Signs ?BP 111/72 (BP Location: Left Arm)   Pulse 72   Temp 98.4 ?F (36.9 ?C) (Oral)   Resp 16   LMP 09/14/2021 (Approximate)   SpO2 99%  ? ?Visual Acuity ?Right Eye Distance:   ?Left Eye Distance:   ?Bilateral Distance:   ? ?Right Eye Near:   ?Left Eye Near:    ?Bilateral Near:    ? ?Physical Exam ?Constitutional:   ?   Appearance: She is well-developed.  ?HENT:  ?   Head: Normocephalic.  ?   Mouth/Throat:  ?   Mouth: Mucous membranes are moist.  ?   Pharynx: Posterior oropharyngeal erythema present.  ?   Tonsils: No tonsillar exudate. 2+ on the right. 2+ on the left.  ?Pulmonary:  ?   Effort: Pulmonary effort is normal.  ?Musculoskeletal:  ?   Cervical back: Normal range of motion and neck supple.  ?Skin: ?   General: Skin is warm and dry.  ?Neurological:  ?   General: No focal deficit present.  ?   Mental Status: She is alert and oriented to person, place, and time.  ?Psychiatric:     ?   Mood and Affect: Mood normal.     ?   Behavior: Behavior normal.  ? ? ? ?UC Treatments / Results  ?Labs ?(all labs ordered are listed, but only abnormal results are displayed) ?Labs Reviewed  ?POCT RAPID STREP A (OFFICE) - Abnormal; Notable for the following components:  ?    Result Value  ? Rapid Strep A Screen Positive (*)   ? All other components within normal limits  ? ? ?EKG ? ? ?  Radiology ?No results found. ? ?Procedures ?Procedures (including critical care  time) ? ?Medications Ordered in UC ?Medications - No data to display ? ?Initial Impression / Assessment and Plan / UC Course  ?I have reviewed the triage vital signs and the nursing notes. ? ?Pertinent labs & imaging results that were available during my care of the patient were reviewed by me and considered in my medical decision making (see chart for details). ? ?Strep pharyngitis ? ?Confirmed by rapid testing, discussed findings with patient, will prescribe Augmentin 10-day course for additional coverage, recommended continued use of Tylenol or ibuprofen, may attempt use of salt water gargles, throat lozenges, warm liquids teaspoons of honey for additional supportive care, may follow-up with urgent care as needed, work note given ?Final Clinical Impressions(s) / UC Diagnoses  ? ?Final diagnoses:  ?Strep pharyngitis  ? ? ? ?Discharge Instructions   ? ?  ?Your strep test was positive ? ?Take Augmentin twice daily for 10 days, you should start to see improvement in about 48 hours and steady progression from there ? ?You may use Tylenol or ibuprofen for management of discomfort ? ?You may attempt salt water gargles, warm liquids, soft foods and teaspoons of honey for additional support ? ?You may follow-up with urgent care as needed ? ? ?ED Prescriptions   ? ? Medication Sig Dispense Auth. Provider  ? amoxicillin-clavulanate (AUGMENTIN) 875-125 MG tablet Take 1 tablet by mouth every 12 (twelve) hours for 10 days. 20 tablet Valinda HoarWhite, Rogan Wigley R, NP  ? ?  ? ?PDMP not reviewed this encounter. ?  ?Valinda HoarWhite, Cobi Aldape R, NP ?10/07/21 1007 ? ?

## 2021-10-07 NOTE — ED Triage Notes (Signed)
Pt presents with a ST that started today.  

## 2021-10-07 NOTE — Discharge Instructions (Addendum)
Your strep test was positive ? ?Take Augmentin twice daily for 10 days, you should start to see improvement in about 48 hours and steady progression from there ? ?You may use Tylenol or ibuprofen for management of discomfort ? ?You may attempt salt water gargles, warm liquids, soft foods and teaspoons of honey for additional support ? ?You may follow-up with urgent care as needed ?

## 2021-11-04 ENCOUNTER — Encounter: Payer: Self-pay | Admitting: Emergency Medicine

## 2021-11-04 ENCOUNTER — Ambulatory Visit
Admission: EM | Admit: 2021-11-04 | Discharge: 2021-11-04 | Disposition: A | Discharge status: Home / Self Care | Payer: BC Managed Care – PPO | Attending: Family Medicine | Admitting: Family Medicine

## 2021-11-04 DIAGNOSIS — H1033 Unspecified acute conjunctivitis, bilateral: Secondary | ICD-10-CM

## 2021-11-04 MED ORDER — POLYMYXIN B-TRIMETHOPRIM 10000-0.1 UNIT/ML-% OP SOLN: 1.0000 [drp] | Freq: Three times a day (TID) | OPHTHALMIC | 0 refills | Status: AC

## 2021-11-04 NOTE — ED Triage Notes (Signed)
Pt presents with bilateral eye redness and drainage x 2 days

## 2021-11-04 NOTE — ED Provider Notes (Signed)
Renaldo Fiddler    CSN: [id number] Arrival date & time: 11/04/21  1324      History   Chief Complaint Chief Complaint  Patient presents with   Conjunctivitis    HPI [name] is a 41 y.o. female.   HPI Patient presents today with a 2-day history of left eye redness, crusting and drainage and itching.  Today she noticed that her right is also beginning to have the same symptoms.  She has had some mild nasal congestion although denies any other URI symptoms.  Denies any known contact with anyone who had pinkeye.  Past Medical History:  Diagnosis Date   Anemia    Irregular heartbeat    PTSD (post-traumatic stress disorder)    served in [place]    Patient Active Problem List   Diagnosis Date Noted   Pregnancy 09/04/2016   Cesarean delivery delivered 07/10/2013    Past Surgical History:  Procedure Laterality Date   BREAST BIOPSY     Right breast   BREAST BIOPSY  2003   CESAREAN SECTION N/A 07/09/2013   Procedure: CESAREAN SECTION;  Surgeon: Turner Daniels, MD;  Location: [facility];  Service: Obstetrics;  Laterality: N/A;   CESAREAN SECTION N/A 09/04/2016   Procedure: CESAREAN SECTION;  Surgeon: Richarda Overlie, MD;  Location: [facility];  Service: Obstetrics;  Laterality: N/A;  Need RNFA - Heather K confirmed   TENDON RECONSTRUCTION     Right thumb    OB History     Gravida  3   Para  1   Term  1   Preterm      AB  1   Living  1      SAB  1   IAB      Ectopic      Multiple      Live Births  1            Home Medications    Prior to Admission medications   Medication Sig Start Date End Date Taking? Authorizing Provider  trimethoprim-polymyxin b (POLYTRIM) ophthalmic solution Place 1 drop into both eyes in the morning, at noon, and at bedtime for 7 days. 11/04/21 11/11/21 Yes Bing Neighbors, FNP  esomeprazole (NEXIUM) 40 MG capsule Take 40 mg by mouth daily.     [provider]  ibuprofen (ADVIL,MOTRIN) 800  MG tablet Take 1 tablet (800 mg total) by mouth every 8 (eight) hours as needed. 08/18/18   Tilden Fossa, MD  methocarbamol (ROBAXIN) 500 MG tablet Take 1 tablet (500 mg total) by mouth every 8 (eight) hours as needed. 04/07/18   Petrucelli, Samantha R, PA-C  Multiple Vitamin (MULTI-VITAMIN) tablet Take 1 tablet by mouth daily.    [provider]  naproxen (NAPROSYN) 500 MG tablet Take 1 tablet (500 mg total) by mouth 2 (two) times daily. 04/07/18   Petrucelli, Samantha R, PA-C  ondansetron (ZOFRAN ODT) 4 MG disintegrating tablet Take 1 tablet (4 mg total) by mouth every 8 (eight) hours as needed for nausea or vomiting. 08/18/18   Tilden Fossa, MD  rOPINIRole (REQUIP) 0.5 MG tablet Take 1/2 tablet by mouth once daily at bedtime for 1 week, then increase the dose to 1 tablet by mouth once daily at bedtime. 10/09/20   [provider]    Family History Family History  Problem Relation Age of Onset   Hypertension Mother    Deep vein thrombosis Mother    Osteopenia Mother    Heart disease  Father    Cancer Father        lung   Stomach cancer Sister    Cancer Brother        brain   Hypertension Maternal Grandmother    Stomach cancer Maternal Grandmother     Social History Social History   Tobacco Use   Smoking status: Former   Smokeless tobacco: Never  Building services engineerVaping Use   Vaping Use: Never used  Substance Use Topics   Alcohol use: Yes    Comment: occasional   Drug use: No     Allergies   Codeine, Oxycodone, and Vicodin [hydrocodone-acetaminophen]   Review of Systems Review of Systems Pertinent negatives listed in HPI  Physical Exam Triage Vital Signs ED Triage Vitals  Enc Vitals Group     BP 11/04/21 1350 111/63     Pulse Rate 11/04/21 1350 68     Resp 11/04/21 1350 16     Temp 11/04/21 1350 98.1 F (36.7 C)     Temp Source 11/04/21 1350 Oral     SpO2 11/04/21 1350 98 %     Weight --      Height --      Head Circumference --      Peak Flow --       Pain Score 11/04/21 1356 2     Pain Loc --      Pain Edu? --      Excl. in GC? --    No data found.  Updated Vital Signs BP 111/63 (BP Location: Left Arm)   Pulse 68   Temp 98.1 F (36.7 C) (Oral)   Resp 16   SpO2 98%   Visual Acuity Right Eye Distance:   Left Eye Distance:   Bilateral Distance:    Right Eye Near:   Left Eye Near:    Bilateral Near:     Physical Exam Constitutional:      Appearance: Normal appearance.  HENT:     Head: Normocephalic and atraumatic.  Eyes:     General:        Right eye: Discharge present.        Left eye: Discharge present.    Comments: Bilateral eye erythema   Cardiovascular:     Rate and Rhythm: Normal rate and regular rhythm.  Pulmonary:     Effort: Pulmonary effort is normal.     Breath sounds: Normal breath sounds.  Musculoskeletal:     Cervical back: Normal range of motion.  Skin:    General: Skin is warm and dry.     Capillary Refill: Capillary refill takes less than 2 seconds.  Neurological:     General: No focal deficit present.     Mental Status: She is alert.  Psychiatric:        Mood and Affect: Mood normal.        Behavior: Behavior normal.        Thought Content: Thought content normal.        Judgment: Judgment normal.     UC Treatments / Results  Labs (all labs ordered are listed, but only abnormal results are displayed) Labs Reviewed - No data to display  EKG   Radiology No results found.  Procedures Procedures (including critical care time)  Medications Ordered in UC Medications - No data to display  Initial Impression / Assessment and Plan / UC Course  I have reviewed the triage vital signs and the nursing notes.  Pertinent labs & imaging results that were  available during my care of the patient were reviewed by me and considered in my medical decision making (see chart for details).    Acute bacterial conjunctivitis involving both eyes Treatment with Polytrim 3 times daily x7  days Frequent hand hygiene recommended to prevent recurrent infection. Return as needed. Final Clinical Impressions(s) / UC Diagnoses   Final diagnoses:  Acute bacterial conjunctivitis of both eyes   Discharge Instructions   None    ED Prescriptions     Medication Sig Dispense Auth. Provider   trimethoprim-polymyxin b (POLYTRIM) ophthalmic solution Place 1 drop into both eyes in the morning, at noon, and at bedtime for 7 days. 10 mL Bing Neighbors, FNP      PDMP not reviewed this encounter.   Bing Neighbors, FNP 11/04/21 [phone]

## 2022-06-22 ENCOUNTER — Other Ambulatory Visit: Payer: Self-pay

## 2022-06-22 ENCOUNTER — Emergency Department
Admission: EM | Admit: 2022-06-22 | Discharge: 2022-06-22 | Disposition: A | Discharge status: Home / Self Care | Payer: BC Managed Care – PPO | Attending: Emergency Medicine | Admitting: Emergency Medicine

## 2022-06-22 ENCOUNTER — Emergency Department: Payer: BC Managed Care – PPO

## 2022-06-22 DIAGNOSIS — Z1152 Encounter for screening for COVID-19: Secondary | ICD-10-CM | POA: Insufficient documentation

## 2022-06-22 DIAGNOSIS — J205 Acute bronchitis due to respiratory syncytial virus: Secondary | ICD-10-CM | POA: Insufficient documentation

## 2022-06-22 DIAGNOSIS — R059 Cough, unspecified: Secondary | ICD-10-CM | POA: Diagnosis present

## 2022-06-22 LAB — URINALYSIS, ROUTINE W REFLEX MICROSCOPIC
Bilirubin Urine: NEGATIVE
Glucose, UA: NEGATIVE mg/dL
Hgb urine dipstick: NEGATIVE
Ketones, ur: 5 mg/dL — AB
Leukocytes,Ua: NEGATIVE
Nitrite: NEGATIVE
Protein, ur: NEGATIVE mg/dL
Specific Gravity, Urine: 1.02 (ref 1.005–1.030)
pH: 7 (ref 5.0–8.0)

## 2022-06-22 LAB — RESP PANEL BY RT-PCR (RSV, FLU A&B, COVID)  RVPGX2
Influenza A by PCR: NEGATIVE
Influenza B by PCR: NEGATIVE
Resp Syncytial Virus by PCR: POSITIVE — AB
SARS Coronavirus 2 by RT PCR: NEGATIVE

## 2022-06-22 MED ORDER — ACETAMINOPHEN 500 MG PO TABS
1000.0000 mg | ORAL_TABLET | Freq: Once | ORAL | Status: AC
  Administered 2022-06-22: 1000 mg via ORAL
  Filled 2022-06-22: qty 2

## 2022-06-22 MED ORDER — FLUTICASONE PROPIONATE 50 MCG/ACT NA SUSP: 1.0000 | Freq: Every day | NASAL | 2 refills | Status: AC

## 2022-06-22 MED ORDER — ONDANSETRON 4 MG PO TBDP
4.0000 mg | ORAL_TABLET | Freq: Once | ORAL | Status: AC
Start: 2022-06-22 — End: 2022-06-22
  Administered 2022-06-22: 4 mg via ORAL
  Filled 2022-06-22: qty 1

## 2022-06-22 MED ORDER — PROMETHAZINE-DM 6.25-15 MG/5ML PO SYRP: 5.0000 mL | ORAL_SOLUTION | Freq: Four times a day (QID) | ORAL | 0 refills | Status: AC | PRN

## 2022-06-22 NOTE — ED Provider Notes (Signed)
[facility] EMERGENCY DEPARTMENT Provider Note   CSN: [id number] Arrival date & time: 06/22/22  1245     History  No chief complaint on file.   [name] is a 42 y.o. female.  Presents to the emergency department for evaluation of of 2 days of headache, cough, congestion, runny nose, body aches and chills.  No fevers.  Vomited 1 times today with nausea.  No past medical history.  She does not take any medications for symptoms.  No abdominal pain.  Normal bowel movement.  HPI     Home Medications Prior to Admission medications   Medication Sig Start Date End Date Taking? Authorizing Provider  fluticasone (FLONASE) 50 MCG/ACT nasal spray Place 1 spray into both nostrils daily. 06/22/22 06/22/23 Yes Evon Slack, PA-C  promethazine-dextromethorphan (PROMETHAZINE-DM) 6.25-15 MG/5ML syrup Take 5 mLs by mouth 4 (four) times daily as needed for cough. 06/22/22  Yes Evon Slack, PA-C  esomeprazole (NEXIUM) 40 MG capsule Take 40 mg by mouth daily.     [provider]  ibuprofen (ADVIL,MOTRIN) 800 MG tablet Take 1 tablet (800 mg total) by mouth every 8 (eight) hours as needed. 08/18/18   Tilden Fossa, MD  methocarbamol (ROBAXIN) 500 MG tablet Take 1 tablet (500 mg total) by mouth every 8 (eight) hours as needed. 04/07/18   Petrucelli, Samantha R, PA-C  Multiple Vitamin (MULTI-VITAMIN) tablet Take 1 tablet by mouth daily.    [provider]  naproxen (NAPROSYN) 500 MG tablet Take 1 tablet (500 mg total) by mouth 2 (two) times daily. 04/07/18   Petrucelli, Samantha R, PA-C  ondansetron (ZOFRAN ODT) 4 MG disintegrating tablet Take 1 tablet (4 mg total) by mouth every 8 (eight) hours as needed for nausea or vomiting. 08/18/18   Tilden Fossa, MD  rOPINIRole (REQUIP) 0.5 MG tablet Take 1/2 tablet by mouth once daily at bedtime for 1 week, then increase the dose to 1 tablet by mouth once daily at bedtime. 10/09/20   [provider]       Allergies    Codeine, Oxycodone, and Vicodin [hydrocodone-acetaminophen]    Review of Systems   Review of Systems  Physical Exam Updated Vital Signs BP 124/80   Pulse 65   Temp (!) 97.5 F (36.4 C) (Oral)   Resp 16   LMP 06/18/2022   SpO2 100%  Physical Exam Constitutional:      Appearance: She is well-developed.  HENT:     Head: Normocephalic and atraumatic.     Right Ear: External ear normal.     Left Ear: External ear normal.     Nose: Rhinorrhea present. No congestion.     Mouth/Throat:     Pharynx: No oropharyngeal exudate or posterior oropharyngeal erythema.  Eyes:     Conjunctiva/sclera: Conjunctivae normal.  Cardiovascular:     Rate and Rhythm: Normal rate.  Pulmonary:     Effort: Pulmonary effort is normal. No respiratory distress.     Breath sounds: No wheezing or rales.  Abdominal:     General: Bowel sounds are normal. There is no distension.     Tenderness: There is no abdominal tenderness. There is no guarding.  Musculoskeletal:        General: Normal range of motion.     Cervical back: Normal range of motion.  Skin:    General: Skin is warm.     Findings: No rash.  Neurological:     General: No focal deficit present.  Mental Status: She is alert and oriented to person, place, and time. Mental status is at baseline.     Cranial Nerves: No cranial nerve deficit.  Psychiatric:        Behavior: Behavior normal.        Thought Content: Thought content normal.     ED Results / Procedures / Treatments   Labs (all labs ordered are listed, but only abnormal results are displayed) Labs Reviewed  RESP PANEL BY RT-PCR (RSV, FLU A&B, COVID)  RVPGX2 - Abnormal; Notable for the following components:      Result Value   Resp Syncytial Virus by PCR POSITIVE (*)    All other components within normal limits  URINALYSIS, ROUTINE W REFLEX MICROSCOPIC - Abnormal; Notable for the following components:   Color, Urine YELLOW (*)    APPearance CLEAR (*)     Ketones, ur 5 (*)    All other components within normal limits    EKG None  Radiology DG Chest 2 View  Result Date: 06/22/2022 CLINICAL DATA:  Headache 08/18/2018 EXAM: CHEST - 2 VIEW COMPARISON:  None Available. FINDINGS: The heart size and mediastinal contours are within normal limits. Both lungs are clear. The visualized skeletal structures are unremarkable. IMPRESSION: No active cardiopulmonary disease. Electronically Signed   By: Donavan Foil M.D.   On: 06/22/2022 17:09    Procedures Procedures    Medications Ordered in ED Medications  ondansetron (ZOFRAN-ODT) disintegrating tablet 4 mg (4 mg Oral Given 06/22/22 1710)  acetaminophen (TYLENOL) tablet 1,000 mg (1,000 mg Oral Given 06/22/22 1710)    ED Course/ Medical Decision Making/ A&P                           Medical Decision Making Amount and/or Complexity of Data Reviewed Labs: ordered. Radiology: ordered.  Risk OTC drugs. Prescription drug management.   42 year old female with positive RSV test.  She has flulike symptoms, vital signs are stable afebrile with normal O2 sats.  Chest x-ray ordered obtained by me today and reviewed shows no acute cardiopulmonary process.  Patient with some mild nausea.  Patient will be treated with dextromethorphan with Phenergan cough syrup along with Flonase for her nasal congestion.  She understands signs symptoms return to the ER for.   Final Clinical Impression(s) / ED Diagnoses Final diagnoses:  RSV bronchitis    Rx / DC Orders ED Discharge Orders          Ordered    promethazine-dextromethorphan (PROMETHAZINE-DM) 6.25-15 MG/5ML syrup  4 times daily PRN        06/22/22 1741    fluticasone (FLONASE) 50 MCG/ACT nasal spray  Daily        06/22/22 1742              Duanne Guess, PA-C 06/22/22 1743    Naaman Plummer, MD 06/22/22 2318

## 2022-06-22 NOTE — ED Triage Notes (Signed)
C/O headache x 2 days, chills, nausea, vomiting x 1

## 2022-06-22 NOTE — Discharge Instructions (Signed)
Please take cough medication as prescribed.  Make sure you are drinking lots of fluids.  Use Flonase as needed for nasal congestion.  Return to the ER for any fevers, shortness of breath, difficulty breathing or any urgent changes in your health.

## 2022-06-22 NOTE — ED Provider Triage Note (Signed)
Emergency Medicine Provider Triage Evaluation Note  BAYLEI SIEBELS , a 42 y.o. female  was evaluated in triage.  Pt complains of fever x 2 days, slight headache, vomiting x 1 today.  Review of Systems  Positive:  Negative:   Physical Exam  BP 124/80   Pulse 65   Temp (!) 97.5 F (36.4 C) (Oral)   Resp 16   LMP 06/18/2022   SpO2 100%  Gen:   Awake, no distress   Resp:  Normal effort  MSK:   Moves extremities without difficulty  Other:    Medical Decision Making  Medically screening exam initiated at 1:05 PM.  Appropriate orders placed.  Iman Reinertsen [place] was informed that the remainder of the evaluation will be completed by another provider, this initial triage assessment does not replace that evaluation, and the importance of remaining in the ED until their evaluation is complete.  Flulike symptoms, will do respiratory panel and UA   Versie Starks, PA-C 06/22/22 1306

## 2022-06-27 ENCOUNTER — Ambulatory Visit
Admission: EM | Admit: 2022-06-27 | Discharge: 2022-06-27 | Disposition: A | Discharge status: Home / Self Care | Payer: Federal, State, Local not specified - PPO | Attending: Emergency Medicine | Admitting: Emergency Medicine

## 2022-06-27 DIAGNOSIS — J205 Acute bronchitis due to respiratory syncytial virus: Secondary | ICD-10-CM

## 2022-06-27 DIAGNOSIS — J029 Acute pharyngitis, unspecified: Secondary | ICD-10-CM

## 2022-06-27 DIAGNOSIS — R0602 Shortness of breath: Secondary | ICD-10-CM

## 2022-06-27 LAB — POCT RAPID STREP A (OFFICE): Rapid Strep A Screen: NEGATIVE

## 2022-06-27 MED ORDER — AZITHROMYCIN 250 MG PO TABS: 250.0000 mg | ORAL_TABLET | Freq: Every day | ORAL | 0 refills | Status: AC

## 2022-06-27 MED ORDER — ALBUTEROL SULFATE HFA 108 (90 BASE) MCG/ACT IN AERS: 1.0000 | INHALATION_SPRAY | Freq: Four times a day (QID) | RESPIRATORY_TRACT | 0 refills | Status: AC | PRN

## 2022-06-27 MED ORDER — PREDNISONE 10 MG (21) PO TBPK: ORAL_TABLET | Freq: Every day | ORAL | 0 refills | Status: AC

## 2022-06-27 MED ORDER — ACETAMINOPHEN 325 MG PO TABS
650.0000 mg | ORAL_TABLET | Freq: Once | ORAL | Status: AC
  Administered 2022-06-27: 650 mg via ORAL

## 2022-06-27 NOTE — ED Triage Notes (Signed)
Patient to Urgent Care with complaints of headache/ cough/ nasal congestion w/ drainage, body aches and chills.  Patient was seen on 1/8 in the ER and diagnosed with RSV bronchitis. Patient was prescribed promethazine-DM and has been taking tylenol cold and flu.   Reports yesterday her symptoms worsened and she started developing- throat swelling, painful swallowing,body aches, chills, headaches, eye pain   Unsure of any fevers.

## 2022-06-27 NOTE — Discharge Instructions (Addendum)
Take the prednisone and Zithromax as directed.  Use the albuterol inhaler as directed.  Follow up with your primary care provider.    

## 2022-06-27 NOTE — ED Provider Notes (Signed)
Renaldo Fiddler    CSN: [id number] Arrival date & time: 06/27/22  1258      History   Chief Complaint Chief Complaint  Patient presents with   Sore Throat    HPI [name] is a 42 y.o. female.  Patient presents with chills, body aches, headache, congestion, sore throat, cough, shortness of breath x 7 days.  Her symptoms have been worse since yesterday.  No fever, chest pain, vomiting, diarrhea, or other symptoms.  Patient was seen at [facility] ED on 06/22/2022; diagnosed with RSV bronchitis; chest x-ray negative; treated with fluticasone nasal spray and Promethazine DM.  Patient reports history of asthma as a child but has not required treatment as an adult.  She has history of strep throat last year.   The history is provided by the patient and medical records.    Past Medical History:  Diagnosis Date   Anemia    Irregular heartbeat    PTSD (post-traumatic stress disorder)    served in [place]    Patient Active Problem List   Diagnosis Date Noted   Pregnancy 09/04/2016   Cesarean delivery delivered 07/10/2013    Past Surgical History:  Procedure Laterality Date   BREAST BIOPSY     Right breast   BREAST BIOPSY  2003   CESAREAN SECTION N/A 07/09/2013   Procedure: CESAREAN SECTION;  Surgeon: Turner Daniels, MD;  Location: [facility] ORS;  Service: Obstetrics;  Laterality: N/A;   CESAREAN SECTION N/A 09/04/2016   Procedure: CESAREAN SECTION;  Surgeon: Richarda Overlie, MD;  Location: [facility]IRTHING SU[facility];  Service: Obstetrics;  Laterality: N/A;  Need RNFA - Heather K confirmed   TENDON RECONSTRUCTION     Right thumb    OB History     Gravida  3   Para  1   Term  1   Preterm      AB  1   Living  1      SAB  1   IAB      Ectopic      Multiple      Live Births  1            Home Medications    Prior to Admission medications   Medication Sig Start Date End Date Taking? Authorizing Provider  albuterol (VENTOLIN HFA) 108 (90 Base) MCG/ACT  inhaler Inhale 1-2 puffs into the lungs every 6 (six) hours as needed. 06/27/22  Yes Mickie Bail, NP  azithromycin (ZITHROMAX) 250 MG tablet Take 1 tablet (250 mg total) by mouth daily. Take first 2 tablets together, then 1 every day until finished. 06/27/22  Yes Mickie Bail, NP  predniSONE (STERAPRED UNI-PAK 21 TAB) 10 MG (21) TBPK tablet Take by mouth daily. As directed 06/27/22  Yes Mickie Bail, NP  esomeprazole (NEXIUM) 40 MG capsule Take 40 mg by mouth daily.     [provider]  fluticasone (FLONASE) 50 MCG/ACT nasal spray Place 1 spray into both nostrils daily. 06/22/22 06/22/23  Evon Slack, PA-C  ibuprofen (ADVIL,MOTRIN) 800 MG tablet Take 1 tablet (800 mg total) by mouth every 8 (eight) hours as needed. 08/18/18   Tilden Fossa, MD  methocarbamol (ROBAXIN) 500 MG tablet Take 1 tablet (500 mg total) by mouth every 8 (eight) hours as needed. 04/07/18   Petrucelli, Samantha R, PA-C  Multiple Vitamin (MULTI-VITAMIN) tablet Take 1 tablet by mouth daily.    [provider]  naproxen (NAPROSYN) 500 MG tablet Take 1 tablet (  500 mg total) by mouth 2 (two) times daily. 04/07/18   Petrucelli, Samantha R, PA-C  ondansetron (ZOFRAN ODT) 4 MG disintegrating tablet Take 1 tablet (4 mg total) by mouth every 8 (eight) hours as needed for nausea or vomiting. 08/18/18   Quintella Reichert, MD  promethazine-dextromethorphan (PROMETHAZINE-DM) 6.25-15 MG/5ML syrup Take 5 mLs by mouth 4 (four) times daily as needed for cough. 06/22/22   Duanne Guess, PA-C  rOPINIRole (REQUIP) 0.5 MG tablet Take 1/2 tablet by mouth once daily at bedtime for 1 week, then increase the dose to 1 tablet by mouth once daily at bedtime. 10/09/20   [provider]    Family History Family History  Problem Relation Age of Onset   Hypertension Mother    Deep vein thrombosis Mother    Osteopenia Mother    Heart disease Father    Cancer Father        lung   Stomach cancer Sister    Cancer Brother         brain   Hypertension Maternal Grandmother    Stomach cancer Maternal Grandmother     Social History Social History   Tobacco Use   Smoking status: Former   Smokeless tobacco: Never  Scientific laboratory technician Use: Never used  Substance Use Topics   Alcohol use: Yes    Comment: occasional   Drug use: No     Allergies   Codeine, Oxycodone, and Vicodin [hydrocodone-acetaminophen]   Review of Systems Review of Systems  Constitutional:  Positive for chills. Negative for fever.  HENT:  Positive for congestion and sore throat. Negative for ear pain.   Respiratory:  Positive for cough and shortness of breath.   Cardiovascular:  Negative for chest pain and palpitations.  Gastrointestinal:  Negative for diarrhea and vomiting.  Skin:  Negative for color change and rash.  Neurological:  Positive for headaches.  All other systems reviewed and are negative.    Physical Exam Triage Vital Signs ED Triage Vitals  Enc Vitals Group     BP 06/27/22 1329 113/77     Pulse Rate 06/27/22 1329 (!) 117     Resp 06/27/22 1329 18     Temp 06/27/22 1329 99.5 F (37.5 C)     Temp src --      SpO2 06/27/22 1329 99 %     Weight --      Height 06/27/22 1327 5\' 8"  (1.727 m)     Head Circumference --      Peak Flow --      Pain Score 06/27/22 1327 9     Pain Loc --      Pain Edu? --      Excl. in [place]? --    No data found.  Updated Vital Signs BP 113/77   Pulse (!) 117   Temp 99.5 F (37.5 C)   Resp 18   Ht 5\' 8"  (1.727 m)   LMP 06/18/2022   SpO2 99%   BMI 20.22 kg/m   Visual Acuity Right Eye Distance:   Left Eye Distance:   Bilateral Distance:    Right Eye Near:   Left Eye Near:    Bilateral Near:     Physical Exam Vitals and nursing note reviewed.  Constitutional:      General: She is not in acute distress.    Appearance: She is well-developed. She is ill-appearing.  HENT:     Right Ear: Tympanic membrane normal.  Left Ear: Tympanic membrane normal.     Nose:  Congestion present.     Mouth/Throat:     Mouth: Mucous membranes are moist.     Pharynx: Posterior oropharyngeal erythema present.  Cardiovascular:     Rate and Rhythm: Normal rate and regular rhythm.     Heart sounds: Normal heart sounds.  Pulmonary:     Effort: Pulmonary effort is normal. No respiratory distress.     Breath sounds: Normal breath sounds. No wheezing, rhonchi or rales.  Musculoskeletal:     Cervical back: Neck supple.  Skin:    General: Skin is warm and dry.  Neurological:     Mental Status: She is alert.  Psychiatric:        Mood and Affect: Mood normal.        Behavior: Behavior normal.      UC Treatments / Results  Labs (all labs ordered are listed, but only abnormal results are displayed) Labs Reviewed  POCT RAPID STREP A (OFFICE)    EKG   Radiology No results found.  Procedures Procedures (including critical care time)  Medications Ordered in UC Medications  acetaminophen (TYLENOL) tablet 650 mg (650 mg Oral Given 06/27/22 1340)    Initial Impression / Assessment and Plan / UC Course  I have reviewed the triage vital signs and the nursing notes.  Pertinent labs & imaging results that were available during my care of the patient were reviewed by me and considered in my medical decision making (see chart for details).    RSV bronchitis, acute pharyngitis, shortness of breath.  Patient has been symptomatic for 7 days and is getting worse.  She reports history of asthma as a child.  Treating with albuterol inhaler, prednisone, Zithromax.  Instructed patient to follow up with her PCP.  ED precautions given.  Education provided on RSV, pharyngitis, shortness of breath.  She agrees to plan of care.    Final Clinical Impressions(s) / UC Diagnoses   Final diagnoses:  RSV bronchitis  Acute pharyngitis, unspecified etiology  Shortness of breath     Discharge Instructions      Take the prednisone and Zithromax as directed.  Use the albuterol  inhaler as directed.  Follow up with your primary care provider.        ED Prescriptions     Medication Sig Dispense Auth. Provider   albuterol (VENTOLIN HFA) 108 (90 Base) MCG/ACT inhaler Inhale 1-2 puffs into the lungs every 6 (six) hours as needed. 18 g Sharion Balloon, NP   predniSONE (STERAPRED UNI-PAK 21 TAB) 10 MG (21) TBPK tablet Take by mouth daily. As directed 21 tablet Sharion Balloon, NP   azithromycin (ZITHROMAX) 250 MG tablet Take 1 tablet (250 mg total) by mouth daily. Take first 2 tablets together, then 1 every day until finished. 6 tablet Sharion Balloon, NP      PDMP not reviewed this encounter.   Sharion Balloon, NP 06/27/22 1404
